# Patient Record
Sex: Male | Born: 1960
Health system: Southern US, Community
[De-identification: ages and names within clinical notes are randomized; demographics above are authoritative.]

## PROBLEM LIST (undated history)

## (undated) DIAGNOSIS — K760 Fatty (change of) liver, not elsewhere classified: Secondary | ICD-10-CM

## (undated) DIAGNOSIS — E781 Pure hyperglyceridemia: Secondary | ICD-10-CM

## (undated) DIAGNOSIS — I10 Essential (primary) hypertension: Secondary | ICD-10-CM

## (undated) DIAGNOSIS — N200 Calculus of kidney: Secondary | ICD-10-CM

## (undated) DIAGNOSIS — T7840XA Allergy, unspecified, initial encounter: Secondary | ICD-10-CM

## (undated) DIAGNOSIS — E669 Obesity, unspecified: Secondary | ICD-10-CM

## (undated) HISTORY — DX: Obesity, unspecified: E66.9

## (undated) HISTORY — DX: Fatty (change of) liver, not elsewhere classified: K76.0

## (undated) HISTORY — DX: Pure hyperglyceridemia: E78.1

## (undated) HISTORY — DX: Essential (primary) hypertension: I10

## (undated) HISTORY — PX: COLONOSCOPY: SHX174

## (undated) HISTORY — PX: URETHRAL STRICTURE DILATATION: SHX477

## (undated) HISTORY — DX: Calculus of kidney: N20.0

## (undated) HISTORY — DX: Allergy, unspecified, initial encounter: T78.40XA

---

## 2009-04-27 ENCOUNTER — Ambulatory Visit: Payer: Self-pay | Admitting: Family Medicine

## 2009-05-10 ENCOUNTER — Ambulatory Visit: Payer: Self-pay | Admitting: Family Medicine

## 2009-07-13 ENCOUNTER — Ambulatory Visit: Payer: Self-pay | Admitting: Family Medicine

## 2009-12-15 ENCOUNTER — Ambulatory Visit: Payer: Self-pay | Admitting: Family Medicine

## 2009-12-28 ENCOUNTER — Ambulatory Visit (HOSPITAL_BASED_OUTPATIENT_CLINIC_OR_DEPARTMENT_OTHER): Admission: RE | Admit: 2009-12-28 | Discharge: 2009-12-28 | Payer: Self-pay | Admitting: Urology

## 2010-09-29 ENCOUNTER — Ambulatory Visit
Admission: RE | Admit: 2010-09-29 | Discharge: 2010-09-29 | Payer: Self-pay | Source: Home / Self Care | Attending: Family Medicine | Admitting: Family Medicine

## 2010-10-06 ENCOUNTER — Ambulatory Visit
Admission: RE | Admit: 2010-10-06 | Discharge: 2010-10-06 | Payer: Self-pay | Source: Home / Self Care | Attending: Family Medicine | Admitting: Family Medicine

## 2010-10-20 ENCOUNTER — Ambulatory Visit: Admit: 2010-10-20 | Payer: Self-pay | Admitting: Family Medicine

## 2010-12-14 LAB — POCT HEMOGLOBIN-HEMACUE: Hemoglobin: 16.6 g/dL (ref 13.0–17.0)

## 2011-03-31 ENCOUNTER — Other Ambulatory Visit: Payer: Self-pay | Admitting: Family Medicine

## 2011-05-12 ENCOUNTER — Other Ambulatory Visit: Payer: Self-pay | Admitting: Family Medicine

## 2011-05-12 ENCOUNTER — Ambulatory Visit (INDEPENDENT_AMBULATORY_CARE_PROVIDER_SITE_OTHER): Payer: 59 | Admitting: Family Medicine

## 2011-05-12 ENCOUNTER — Encounter: Payer: Self-pay | Admitting: Family Medicine

## 2011-05-12 DIAGNOSIS — E119 Type 2 diabetes mellitus without complications: Secondary | ICD-10-CM

## 2011-05-12 DIAGNOSIS — Z79899 Other long term (current) drug therapy: Secondary | ICD-10-CM

## 2011-05-12 DIAGNOSIS — E1169 Type 2 diabetes mellitus with other specified complication: Secondary | ICD-10-CM

## 2011-05-12 DIAGNOSIS — E785 Hyperlipidemia, unspecified: Secondary | ICD-10-CM

## 2011-05-12 DIAGNOSIS — I10 Essential (primary) hypertension: Secondary | ICD-10-CM

## 2011-05-12 DIAGNOSIS — E1159 Type 2 diabetes mellitus with other circulatory complications: Secondary | ICD-10-CM

## 2011-05-12 DIAGNOSIS — I152 Hypertension secondary to endocrine disorders: Secondary | ICD-10-CM

## 2011-05-12 LAB — COMPREHENSIVE METABOLIC PANEL
Albumin: 4.6 g/dL (ref 3.5–5.2)
BUN: 16 mg/dL (ref 6–23)
CO2: 23 mEq/L (ref 19–32)
Calcium: 9.9 mg/dL (ref 8.4–10.5)
Glucose, Bld: 102 mg/dL — ABNORMAL HIGH (ref 70–99)
Potassium: 3.9 mEq/L (ref 3.5–5.3)
Sodium: 139 mEq/L (ref 135–145)
Total Protein: 7 g/dL (ref 6.0–8.3)

## 2011-05-12 LAB — LIPID PANEL
Cholesterol: 138 mg/dL (ref 0–200)
Total CHOL/HDL Ratio: 5.1 Ratio

## 2011-05-12 LAB — POCT GLYCOSYLATED HEMOGLOBIN (HGB A1C): Hemoglobin A1C: 5.4

## 2011-05-12 MED ORDER — GLUCOSE BLOOD VI STRP: ORAL_STRIP | Status: AC

## 2011-05-12 NOTE — Patient Instructions (Signed)
Check your blood sugars periodically either before or 2 hours after a meal. If you do not hear from the nutritionist in 2 weeks, call us.

## 2011-05-12 NOTE — Progress Notes (Signed)
  Subjective:    Patient ID: Trevor Johnson, male    DOB: 20-Nov-1960, 50 y.o.   MRN: [id number]  HPI He is here for a diabetes recheck. He has made some dietary changes and has lost several pounds. He also has not been able to check his blood sugars. He had difficulty learning how to use the, or but never called to get help. He has not been seen by the nutritionist. His exercise is minimal. He continues on medications listed in the chart and is having no difficulty with them. He does not smoke. His wife did join on this visit.   Review of Systems     Objective:   Physical Exam Alert and in no distress. Glowed A1c is 5.4       Assessment & Plan:  Diabetes. Hypertension. Hyperlipidemia. Obesity. I again discussed the need for him to periodically check his blood sugars either before a meal or 2 hours after a meal. He is also increase his physical activity to a one half hour 5 days per week. Continue on present medications. Discussed the need for him to get to a waist size of approximately 36 and have this as his goal rather than a true weight. He will be set up to see the nutritionist and return here in 4 months. Lipid panel and cmet

## 2011-05-15 ENCOUNTER — Telehealth: Payer: Self-pay

## 2011-05-15 NOTE — Telephone Encounter (Signed)
Called pt he said he is takeing the tricor

## 2011-05-16 ENCOUNTER — Telehealth: Payer: Self-pay

## 2011-05-16 NOTE — Telephone Encounter (Signed)
LEFT MESSAGE DR.LALONDE WANTS HIM TO MAKE APPT TO COME IN TO DISCUSS LABS

## 2011-06-02 ENCOUNTER — Ambulatory Visit (INDEPENDENT_AMBULATORY_CARE_PROVIDER_SITE_OTHER): Payer: 59 | Admitting: Family Medicine

## 2011-06-02 VITALS — BP 120/80 | HR 100 | Wt 270.0 lb

## 2011-06-02 DIAGNOSIS — E1169 Type 2 diabetes mellitus with other specified complication: Secondary | ICD-10-CM | POA: Insufficient documentation

## 2011-06-02 DIAGNOSIS — E119 Type 2 diabetes mellitus without complications: Secondary | ICD-10-CM

## 2011-06-02 DIAGNOSIS — E669 Obesity, unspecified: Secondary | ICD-10-CM

## 2011-06-02 DIAGNOSIS — Z041 Encounter for examination and observation following transport accident: Secondary | ICD-10-CM

## 2011-06-02 DIAGNOSIS — Z23 Encounter for immunization: Secondary | ICD-10-CM

## 2011-06-02 DIAGNOSIS — E785 Hyperlipidemia, unspecified: Secondary | ICD-10-CM

## 2011-06-02 DIAGNOSIS — E1139 Type 2 diabetes mellitus with other diabetic ophthalmic complication: Secondary | ICD-10-CM | POA: Insufficient documentation
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
## 2011-06-02 NOTE — Patient Instructions (Addendum)
Return here Monday for fasting blood work. Start checking your blood sugars daily either before a meal or 2 Rx after a meal.

## 2011-06-05 ENCOUNTER — Other Ambulatory Visit: Payer: 59

## 2011-06-05 DIAGNOSIS — E785 Hyperlipidemia, unspecified: Secondary | ICD-10-CM

## 2011-06-05 LAB — LIPID PANEL
Cholesterol: 120 mg/dL (ref 0–200)
HDL: 29 mg/dL — ABNORMAL LOW (ref >39)
Triglycerides: 304 mg/dL — ABNORMAL HIGH (ref <150)

## 2011-06-06 NOTE — Progress Notes (Signed)
Mailed copy of results and diet info

## 2011-06-12 ENCOUNTER — Other Ambulatory Visit: Payer: Self-pay | Admitting: Family Medicine

## 2011-07-16 ENCOUNTER — Other Ambulatory Visit: Payer: Self-pay | Admitting: Family Medicine

## 2011-07-17 ENCOUNTER — Other Ambulatory Visit: Payer: Self-pay | Admitting: Family Medicine

## 2011-07-17 ENCOUNTER — Telehealth: Payer: Self-pay | Admitting: Family Medicine

## 2011-07-17 MED ORDER — METFORMIN HCL ER 500 MG PO TB24: 500.0000 mg | ORAL_TABLET | Freq: Two times a day (BID) | ORAL | Status: DC

## 2011-07-25 ENCOUNTER — Encounter: Payer: Self-pay | Admitting: Family Medicine

## 2011-07-25 ENCOUNTER — Other Ambulatory Visit: Payer: Self-pay | Admitting: Family Medicine

## 2011-07-25 NOTE — Telephone Encounter (Signed)
Is this okay to refill? 

## 2011-07-25 NOTE — Telephone Encounter (Signed)
CLOSE OVER 1 WK OLD

## 2011-07-25 NOTE — Telephone Encounter (Signed)
Set him up an appointment

## 2011-07-26 NOTE — Telephone Encounter (Signed)
He has an appt in December already to come in and he said he came in a month ago. Does he need to still come in? The pharmacy has called him and said that the metformin he was prescribed to was not the one they have been refilling.

## 2011-08-30 ENCOUNTER — Encounter: Payer: Self-pay | Admitting: Medical

## 2011-08-30 ENCOUNTER — Ambulatory Visit (INDEPENDENT_AMBULATORY_CARE_PROVIDER_SITE_OTHER): Payer: 59 | Admitting: Medical

## 2011-08-30 VITALS — BP 122/82 | HR 68 | Temp 99.0°F | Resp 16 | Wt 265.0 lb

## 2011-08-30 DIAGNOSIS — J069 Acute upper respiratory infection, unspecified: Secondary | ICD-10-CM

## 2011-08-30 DIAGNOSIS — R059 Cough, unspecified: Secondary | ICD-10-CM

## 2011-08-30 DIAGNOSIS — R05 Cough: Secondary | ICD-10-CM | POA: Insufficient documentation

## 2011-08-30 MED ORDER — AMOXICILLIN 500 MG PO TABS: ORAL_TABLET | ORAL | Status: DC

## 2011-08-30 MED ORDER — HYDROCODONE-HOMATROPINE 5-1.5 MG/5ML PO SYRP: 5.0000 mL | ORAL_SOLUTION | Freq: Four times a day (QID) | ORAL | Status: DC | PRN

## 2011-08-30 NOTE — Patient Instructions (Signed)
Rest, drink plenty of water, try Mucinex DM or Coricidin HBP OTC for cough and congestion, Ibuprofen 200mg , 3-4 tablets every 6 hours for aches and fever.,  You can use Hycodan syrup for cough.  If worse over the next few days, particularly worse fever, worse head pressure and thick drainage, then begin Amoxicillin.    Call or return if worse or not improving.

## 2011-08-30 NOTE — Progress Notes (Signed)
Subjective:   HPI  Trevor Johnson is a 50 y.o. male who presents with 4 day hx/o cough, headache, sinus pressure, malaise, earache, chills, sweats, runny nose, aches, and worsening over the last few days.  He has +sick contacts.  Using Dayquil and Nyquil.  He denies tobacco use, no diarrhea, but he did have some nausea and vomiting few times.  Usually is not sick.  He did get flu shot this year.  He is a diabetic with good control per his report.  No other aggravating or relieving factors.  He notes fever up to 101.  No other c/o.  The following portions of the patient's history were reviewed and updated as appropriate: allergies, current medications, past family history, past medical history, past social history, past surgical history and problem list.  Past Medical History  Diagnosis Date  . Obesity   . Allergy     RHINITIS  . Renal stone   . Hypertension   . Diabetes mellitus   . Hypertriglyceridemia    Review of Systems Constitutional: +fever, +chills, +sweats, -unexpected -weight change,+fatigue ENT: +runny nose, +ear pain, +sore throat Cardiology:  -chest pain, -palpitations, -edema Respiratory: +cough, -shortness of breath, -wheezing Gastroenterology: -abdominal pain, +nausea, -vomiting, -diarrhea, -constipation Hematology: -bleeding or bruising problems Musculoskeletal: -arthralgias, -myalgias, -joint swelling, -back pain Ophthalmology: -vision changes Urology: -dysuria, -difficulty urinating, -hematuria, -urinary frequency, -urgency Neurology: -headache, -weakness, -tingling, -numbness   Objective:   Filed Vitals:   08/30/11 1204  BP: 122/82  Pulse: 68  Temp: 99 F (37.2 C)  Resp: 16    General appearance: Alert, WD/WN, no distress, ill appearing                             Skin: warm, no rash                           Head: mild sinus tenderness                            Eyes: conjunctiva normal, corneas clear, PERRLA                            Ears: pearly  TMs, external ear canals normal                          Nose: septum midline, turbinates swollen, with erythema and clear discharge             Mouth/throat: MMM, tongue normal, mild pharyngeal erythema                           Neck: supple, no adenopathy, no thyromegaly, nontender                          Heart: RRR, normal S1, S2, no murmurs                         Lungs: CTA bilaterally, no wheezes, rales, or rhonchi     Assessment and Plan:   Encounter Diagnoses  Name Primary?  . URI (upper respiratory infection) Yes  . Cough    Discussed diagnosis and treatment of URI.  Suggested symptomatic OTC remedies.   Consider  Mucinex or Coricidin instead of Dayquil given his hx/o HTN.   Nasal saline spray for congestion.  Tylenol or Ibuprofen OTC for fever and malaise.  Hycodan for worse cough QHS prn.  If worsening over the next few days, can begin Amoxicillin, otherwise current findings suggest viral URI. Call/return in 2-3 days if symptoms aren't resolving.

## 2011-09-04 ENCOUNTER — Telehealth: Payer: Self-pay | Admitting: Internal Medicine

## 2011-09-04 NOTE — Telephone Encounter (Signed)
This one is for you 

## 2011-09-05 ENCOUNTER — Other Ambulatory Visit: Payer: Self-pay | Admitting: Medical

## 2011-09-05 MED ORDER — HYDROCODONE-HOMATROPINE 5-1.5 MG/5ML PO SYRP: 5.0000 mL | ORAL_SOLUTION | Freq: Four times a day (QID) | ORAL | Status: AC | PRN

## 2011-09-05 NOTE — Telephone Encounter (Signed)
CALLED OUT A REFILL ON HYCODON TO THE PHARMACY PER SHANE TYSINGER. CLS  DONE!

## 2011-09-05 NOTE — Telephone Encounter (Signed)
See separate msg.  Pls call out refill on cough medication, see if he started the antibiotic?  What are his current symptoms? Fever?  Is he better, same, worse?

## 2011-09-11 ENCOUNTER — Encounter: Payer: Self-pay | Admitting: Family Medicine

## 2011-09-11 ENCOUNTER — Ambulatory Visit (INDEPENDENT_AMBULATORY_CARE_PROVIDER_SITE_OTHER): Payer: 59 | Admitting: Family Medicine

## 2011-09-11 VITALS — BP 130/90 | HR 80 | Wt 267.0 lb

## 2011-09-11 DIAGNOSIS — E669 Obesity, unspecified: Secondary | ICD-10-CM

## 2011-09-11 DIAGNOSIS — Z79899 Other long term (current) drug therapy: Secondary | ICD-10-CM

## 2011-09-11 DIAGNOSIS — E119 Type 2 diabetes mellitus without complications: Secondary | ICD-10-CM

## 2011-09-11 DIAGNOSIS — E785 Hyperlipidemia, unspecified: Secondary | ICD-10-CM

## 2011-09-11 DIAGNOSIS — I152 Hypertension secondary to endocrine disorders: Secondary | ICD-10-CM

## 2011-09-11 DIAGNOSIS — I10 Essential (primary) hypertension: Secondary | ICD-10-CM

## 2011-09-11 DIAGNOSIS — E1159 Type 2 diabetes mellitus with other circulatory complications: Secondary | ICD-10-CM

## 2011-09-11 DIAGNOSIS — Z23 Encounter for immunization: Secondary | ICD-10-CM

## 2011-09-11 DIAGNOSIS — E1169 Type 2 diabetes mellitus with other specified complication: Secondary | ICD-10-CM

## 2011-09-11 NOTE — Patient Instructions (Signed)
Keep up the good work

## 2011-09-11 NOTE — Progress Notes (Signed)
  Subjective:    Patient ID: Trevor Johnson, male    DOB: 03-31-1961, 50 y.o.   MRN: [id number]  HPI He is here for a diabetes recheck. He has made some dietary exercise changes. He is checking his blood sugars intermittently and at various times throughout the day. Continues on medications listed in the chart. Smoking and drinking were reviewed. His work continues to go well.   Review of Systems Negative except as above    Objective:   Physical Exam Alert and in no distress. Hemoglobin A1c is 5.4.       Assessment & Plan:   1. Type II or unspecified type diabetes mellitus without mention of complication, not stated as uncontrolled  POCT HgB A1C  2. Diabetes mellitus  CBC with Differential, Comprehensive metabolic panel, Lipid panel  3. Hyperlipidemia LDL goal < 70  Lipid panel  4. Obesity (BMI 30-39.9)    5. Hypertension associated with diabetes  CBC with Differential, Comprehensive metabolic panel  6. Encounter for long-term (current) use of other medications  CBC with Differential, Comprehensive metabolic panel, Lipid panel   Encouraged him to continue take good care of himself in regard to diet and exercise. Also recommend he check his blood sugars either before a meal or 2 hours after a meal. Followup here in 4 months.

## 2011-09-12 LAB — COMPREHENSIVE METABOLIC PANEL
Albumin: 4.4 g/dL (ref 3.5–5.2)
BUN: 12 mg/dL (ref 6–23)
CO2: 24 mEq/L (ref 19–32)
Glucose, Bld: 102 mg/dL — ABNORMAL HIGH (ref 70–99)
Sodium: 138 mEq/L (ref 135–145)
Total Bilirubin: 0.6 mg/dL (ref 0.3–1.2)
Total Protein: 6.6 g/dL (ref 6.0–8.3)

## 2011-09-12 LAB — CBC WITH DIFFERENTIAL/PLATELET
Eosinophils Absolute: 0.1 10*3/uL (ref 0.0–0.7)
HCT: 46.2 % (ref 39.0–52.0)
Hemoglobin: 15.1 g/dL (ref 13.0–17.0)
Lymphs Abs: 1.8 10*3/uL (ref 0.7–4.0)
MCH: 30.8 pg (ref 26.0–34.0)
MCHC: 32.7 g/dL (ref 30.0–36.0)
MCV: 94.3 fL (ref 78.0–100.0)
Monocytes Absolute: 0.5 10*3/uL (ref 0.1–1.0)
Monocytes Relative: 8 % (ref 3–12)
Neutrophils Relative %: 62 % (ref 43–77)
RBC: 4.9 MIL/uL (ref 4.22–5.81)

## 2011-09-12 LAB — LIPID PANEL
Cholesterol: 108 mg/dL (ref 0–200)
HDL: 23 mg/dL — ABNORMAL LOW (ref >39)
Triglycerides: 410 mg/dL — ABNORMAL HIGH (ref <150)

## 2011-10-13 ENCOUNTER — Other Ambulatory Visit: Payer: Self-pay | Admitting: Family Medicine

## 2011-11-08 ENCOUNTER — Other Ambulatory Visit: Payer: Self-pay | Admitting: Family Medicine

## 2011-12-06 ENCOUNTER — Ambulatory Visit: Payer: 59 | Admitting: Medical

## 2012-01-08 ENCOUNTER — Ambulatory Visit: Payer: 59 | Admitting: Family Medicine

## 2012-01-15 ENCOUNTER — Ambulatory Visit (INDEPENDENT_AMBULATORY_CARE_PROVIDER_SITE_OTHER): Payer: 59 | Admitting: Family Medicine

## 2012-01-15 ENCOUNTER — Encounter: Payer: Self-pay | Admitting: Family Medicine

## 2012-01-15 DIAGNOSIS — E119 Type 2 diabetes mellitus without complications: Secondary | ICD-10-CM

## 2012-01-15 DIAGNOSIS — E785 Hyperlipidemia, unspecified: Secondary | ICD-10-CM

## 2012-01-15 DIAGNOSIS — E669 Obesity, unspecified: Secondary | ICD-10-CM

## 2012-01-15 LAB — POCT UA - MICROALBUMIN
Albumin/Creatinine Ratio, Urine, POC: 4.4
Creatinine, POC: 113.3 mg/dL

## 2012-01-15 NOTE — Progress Notes (Signed)
  Subjective:    Patient ID: Trevor Johnson, male    DOB: May 02, 1961, 51 y.o.   MRN: [id number]  HPI He is here for a diabetes recheck. He continues on medicines listed in the chart. He has had difficulty sticking with his diet. He admits to eating lots of sweets. His exercise is quite minimal. Smoking and drinking history was reviewed.   Review of Systems     Objective:   Physical Exam Alert and in no distress. Hemoglobin A1c is 5.7.       Assessment & Plan:   1. Diabetes mellitus  POCT HgB A1C, POCT UA - Microalbumin  2. Obesity (BMI 30-39.9)    3. Hyperlipidemia LDL goal < 70     proximally half an hour spent discussing diabetes care with him especially in regard to diet and exercise. Strongly encouraged him to approach this is what can be done rather than what cannot be done as specimen are to foods.

## 2012-01-15 NOTE — Patient Instructions (Signed)
Think positive. Remember the pink elephant!

## 2012-02-14 ENCOUNTER — Other Ambulatory Visit: Payer: Self-pay | Admitting: Family Medicine

## 2012-05-20 ENCOUNTER — Other Ambulatory Visit: Payer: Self-pay | Admitting: Family Medicine

## 2012-05-20 ENCOUNTER — Encounter: Payer: Self-pay | Admitting: Family Medicine

## 2012-05-20 ENCOUNTER — Ambulatory Visit (INDEPENDENT_AMBULATORY_CARE_PROVIDER_SITE_OTHER): Payer: 59 | Admitting: Family Medicine

## 2012-05-20 VITALS — Wt 281.0 lb

## 2012-05-20 DIAGNOSIS — E669 Obesity, unspecified: Secondary | ICD-10-CM

## 2012-05-20 DIAGNOSIS — E785 Hyperlipidemia, unspecified: Secondary | ICD-10-CM

## 2012-05-20 DIAGNOSIS — I152 Hypertension secondary to endocrine disorders: Secondary | ICD-10-CM

## 2012-05-20 DIAGNOSIS — E1169 Type 2 diabetes mellitus with other specified complication: Secondary | ICD-10-CM

## 2012-05-20 DIAGNOSIS — E1159 Type 2 diabetes mellitus with other circulatory complications: Secondary | ICD-10-CM | POA: Insufficient documentation

## 2012-05-20 DIAGNOSIS — E119 Type 2 diabetes mellitus without complications: Secondary | ICD-10-CM

## 2012-05-20 DIAGNOSIS — I10 Essential (primary) hypertension: Secondary | ICD-10-CM

## 2012-05-20 DIAGNOSIS — Z87442 Personal history of urinary calculi: Secondary | ICD-10-CM

## 2012-05-20 NOTE — Progress Notes (Signed)
  Subjective:    Patient ID: Trevor Johnson, male    DOB: 11-26-1960, 51 y.o.   MRN: [id number]  HPI He is here for a recheck. He has started working out. He notes that his shirts are fitting looser as well as his pants. Weight is elevated and probably related to muscle. He continues on medications listed in the chart. He is not check his blood sugars since his last visit. Is not smoke, does not drink  Review of Systems     Objective:   Physical Exam HbA1C 5.6       Assessment & Plan:   1. Diabetes mellitus  POCT glycosylated hemoglobin (Hb A1C)  2. Obesity (BMI 30-39.9)    3. Hyperlipidemia LDL goal < 70    4. Hypertension associated with diabetes    5. History of renal stone     Check your blood sugar either before a meal or 2 hours after a meal. Any blood sugar above 180, I want to know about it. I may consider stopping his metformin with his next visit. He is to work on getting his waist size down under 40.

## 2012-05-20 NOTE — Patient Instructions (Addendum)
Check your blood sugar either before a meal or 2 hours after a meal. Any blood sugar above 180, I want to know about it.   If you get your waist size down under 40, I will stop your metformin!!!

## 2012-06-14 ENCOUNTER — Other Ambulatory Visit (INDEPENDENT_AMBULATORY_CARE_PROVIDER_SITE_OTHER): Payer: 59

## 2012-06-14 DIAGNOSIS — Z23 Encounter for immunization: Secondary | ICD-10-CM

## 2012-07-31 ENCOUNTER — Other Ambulatory Visit: Payer: Self-pay | Admitting: Family Medicine

## 2012-09-16 ENCOUNTER — Ambulatory Visit: Payer: 59 | Admitting: Family Medicine

## 2012-10-29 ENCOUNTER — Other Ambulatory Visit: Payer: Self-pay

## 2012-10-29 MED ORDER — LISINOPRIL-HYDROCHLOROTHIAZIDE 10-12.5 MG PO TABS: 1.0000 | ORAL_TABLET | Freq: Every day | ORAL | Status: DC

## 2012-10-29 MED ORDER — ATORVASTATIN CALCIUM 20 MG PO TABS: 20.0000 mg | ORAL_TABLET | Freq: Every day | ORAL | Status: DC

## 2012-10-29 MED ORDER — METFORMIN HCL 500 MG PO TABS: 500.0000 mg | ORAL_TABLET | Freq: Two times a day (BID) | ORAL | Status: DC

## 2012-10-29 NOTE — Telephone Encounter (Signed)
Sent in meds 

## 2012-11-05 ENCOUNTER — Other Ambulatory Visit: Payer: Self-pay | Admitting: Family Medicine

## 2013-02-06 ENCOUNTER — Telehealth: Payer: Self-pay | Admitting: Internal Medicine

## 2013-02-06 NOTE — Telephone Encounter (Signed)
Left message asking patient to return my call to schedule fasting med check with Dr.Lalonde as he was last seen in Aug 2013 and was due for a 4 month follow up in Dec. Once he scheduled appointment I will call his HCTZ into pharmacy for him.

## 2013-02-06 NOTE — Telephone Encounter (Signed)
Pt needs a refill on htcz 12.5mg  to [facility]

## 2013-02-10 ENCOUNTER — Other Ambulatory Visit: Payer: Self-pay | Admitting: *Deleted

## 2013-02-10 MED ORDER — HYDROCHLOROTHIAZIDE 12.5 MG PO CAPS: 12.5000 mg | ORAL_CAPSULE | ORAL | Status: DC

## 2013-02-10 NOTE — Telephone Encounter (Signed)
Ok called in #30 for him to hold him until appt with Dr.Lalonde 03/10/13.

## 2013-03-10 ENCOUNTER — Encounter: Payer: Self-pay | Admitting: Family Medicine

## 2013-03-10 ENCOUNTER — Ambulatory Visit (INDEPENDENT_AMBULATORY_CARE_PROVIDER_SITE_OTHER): Payer: BC Managed Care – PPO | Admitting: Family Medicine

## 2013-03-10 VITALS — BP 120/76 | HR 80 | Wt 245.0 lb

## 2013-03-10 DIAGNOSIS — Z79899 Other long term (current) drug therapy: Secondary | ICD-10-CM

## 2013-03-10 DIAGNOSIS — E119 Type 2 diabetes mellitus without complications: Secondary | ICD-10-CM

## 2013-03-10 LAB — POCT UA - MICROALBUMIN
Creatinine, POC: 87.1 mg/dL
Microalbumin Ur, POC: 31.5 mg/L

## 2013-03-10 NOTE — Progress Notes (Signed)
Subjective:    Trevor Johnson is a 52 y.o. male who presents for follow-up of Type 2 diabetes mellitus.   He was last seen in August. He has made diet and exercise changes and has lost over 30 pounds. Home blood sugar records: DOESN'T CHECK  Current symptoms/problems include  Daily foot checks, foot concerns: TOES TING. Last eye exam:  [facility] 12/2012   Medication compliance: good Current diet: LOW SUGAR Current exercise: GYM  Known diabetic complications: none Cardiovascular risk factors: diabetes mellitus, dyslipidemia and male gender   The following portions of the patient's history were reviewed and updated as appropriate: allergies, current medications, past family history, past medical history, past social history, past surgical history and problem list.  ROS as in subjective above    Objective:   General appearence: alert, no distress, WD/WN Neck: supple, no lymphadenopathy, no thyromegaly, no masses Heart: RRR, normal S1, S2, no murmurs Lungs: CTA bilaterally, no wheezes, rhonchi, or rales Abdomen: +bs, soft, non tender, non distended, no masses, no hepatomegaly, no splenomegaly Pulses: 2+ symmetric, upper and lower extremities, normal cap refill Ext: no edema Foot exam:  Neuro: foot monofilament exam normal   Lab Review Lab Results  Component Value Date   HGBA1C 5.6 05/20/2012   Lab Results  Component Value Date   CHOL 108 09/11/2011   HDL 23* 09/11/2011   LDLCALC Comment:   Not calculated due to Triglyceride >400. Suggest ordering Direct LDL (Unit Code: 47829).   Total Cholesterol/HDL Ratio:CHD Risk                        Coronary Heart Disease Risk Table                                        Men       Women          1/2 Average Risk              3.4        3.3              Average Risk              5.0        4.4           2X Average Risk              9.6        7.1           3X Average Risk             23.4       11.0 Use the calculated  Patient Ratio above and the CHD Risk table  to determine the patient's CHD Risk. ATP III Classification (LDL):       < 100        mg/dL         Optimal      562 - 129     mg/dL         Near or Above Optimal      130 - 159     mg/dL         Borderline High      160 - 189     mg/dL         High       >  190        mg/dL         Very High   91/47/8295   TRIG 410* 09/11/2011   CHOLHDL 4.7 09/11/2011   No results found for this basename: [facility] [place]     Chemistry      Component Value Date/Time   NA 138 09/11/2011 0838   K 4.1 09/11/2011 0838   CL 102 09/11/2011 0838   CO2 24 09/11/2011 0838   BUN 12 09/11/2011 0838   CREATININE 0.93 09/11/2011 0838      Component Value Date/Time   CALCIUM 9.4 09/11/2011 0838   ALKPHOS 71 09/11/2011 0838   AST 32 09/11/2011 0838   ALT 47 09/11/2011 0838   BILITOT 0.6 09/11/2011 0838        Chemistry      Component Value Date/Time   NA 138 09/11/2011 0838   K 4.1 09/11/2011 0838   CL 102 09/11/2011 0838   CO2 24 09/11/2011 0838   BUN 12 09/11/2011 0838   CREATININE 0.93 09/11/2011 0838      Component Value Date/Time   CALCIUM 9.4 09/11/2011 0838   ALKPHOS 71 09/11/2011 0838   AST 32 09/11/2011 0838   ALT 47 09/11/2011 0838   BILITOT 0.6 09/11/2011 0838       Last optometry/ophthalmology exam reviewed from:    Assessment:  Diabetes mellitus - Plan: POCT UA - Microalbumin, Hemoglobin A1c, CANCELED: POCT glycosylated hemoglobin (Hb A1C)  Encounter for long-term (current) use of other medications - Plan: CBC with Differential, Comprehensive metabolic panel, Lipid panel, HM COLONOSCOPY        Plan:    1.  Rx changes: none 2.  Education: Reviewed 'ABCs' of diabetes management (respective goals in parentheses):  A1C (<7), blood pressure (<130/80), and cholesterol (LDL <100). 3.  Compliance at present is estimated to be good. Efforts to improve compliance (if necessary) will be directed at none. 4. Follow up: 4 months   Encouraged him to continue with his diet and exercise regimen. Encouraged him to check his blood sugars will more frequently.

## 2013-03-10 NOTE — Progress Notes (Signed)
  Subjective:    Patient ID: Trevor Johnson, male    DOB: 09/19/1961, 52 y.o.   MRN: [id number]  HPI   Review of Systems     Objective:   Physical Exam        Assessment & Plan:

## 2013-03-10 NOTE — Patient Instructions (Signed)
Keep up your diet and exercise. We will talk about possibly taking off metformin with her next visit

## 2013-03-11 ENCOUNTER — Other Ambulatory Visit: Payer: Self-pay | Admitting: Family Medicine

## 2013-03-11 ENCOUNTER — Ambulatory Visit (INDEPENDENT_AMBULATORY_CARE_PROVIDER_SITE_OTHER): Payer: BC Managed Care – PPO | Admitting: Family Medicine

## 2013-03-11 DIAGNOSIS — R7309 Other abnormal glucose: Secondary | ICD-10-CM

## 2013-03-11 DIAGNOSIS — E119 Type 2 diabetes mellitus without complications: Secondary | ICD-10-CM

## 2013-03-11 LAB — COMPREHENSIVE METABOLIC PANEL
ALT: 38 U/L (ref 0–53)
Alkaline Phosphatase: 112 U/L (ref 39–117)
Sodium: 126 mEq/L — ABNORMAL LOW (ref 135–145)
Total Bilirubin: 0.3 mg/dL (ref 0.3–1.2)
Total Protein: 6.7 g/dL (ref 6.0–8.3)

## 2013-03-11 LAB — CBC WITH DIFFERENTIAL/PLATELET
Basophils Absolute: 0 10*3/uL (ref 0.0–0.1)
Basophils Relative: 0 % (ref 0–1)
Eosinophils Absolute: 0.1 10*3/uL (ref 0.0–0.7)
MCH: 32.4 pg (ref 26.0–34.0)
MCHC: 33.3 g/dL (ref 30.0–36.0)
Neutrophils Relative %: 40 % — ABNORMAL LOW (ref 43–77)
Platelets: 143 10*3/uL — ABNORMAL LOW (ref 150–400)

## 2013-03-11 LAB — LIPID PANEL: Triglycerides: 5860 mg/dL — ABNORMAL HIGH (ref <150)

## 2013-03-11 LAB — HEMOGLOBIN A1C: Mean Plasma Glucose: 240 mg/dL — ABNORMAL HIGH (ref <117)

## 2013-03-11 MED ORDER — INSULIN LISPRO 100 UNIT/ML ~~LOC~~ SOLN
10.0000 [IU] | Freq: Once | SUBCUTANEOUS | Status: AC
  Administered 2013-03-11: 10 [IU] via SUBCUTANEOUS

## 2013-03-11 MED ORDER — PIOGLITAZONE HCL-METFORMIN HCL 15-850 MG PO TABS: 1.0000 | ORAL_TABLET | Freq: Two times a day (BID) | ORAL | Status: DC

## 2013-03-11 NOTE — Progress Notes (Signed)
Quick Note:  PT STATES HE WILL CALL Thursday MORNING TO MAKE APPT. TO COME IN Friday ______

## 2013-03-11 NOTE — Progress Notes (Signed)
  Subjective:    Patient ID: Trevor Johnson, male    DOB: [birth year], 52 y.o.   MRN: [id number]  HPI He is here for recheck. His blood work was reviewed and was quite abnormal. He stopped his metformin approximately 6 weeks ago and started back on it 2 weeks ago. He now states that he is still having some difficulty with polyuria polydipsia although his vision has improved.   Review of Systems     Objective:   Physical Exam Alert and in no distress. He's not tachypnea. Blood sugar 558       Assessment & Plan:  Diabetes mellitus I discussed the fact that his blood sugar is quite high and we need to get this down relatively quickly. I will start him on 10 units of regular and 10 of Levemir. He is to return in the morning for repeat blood sugar and adjust medicine accordingly. Will be referred to medical nutrition and diabetes. We will also teach him how to use his glucometer tomorrow. I again stressed the fact that he is in danger with blood sugars is elevated. I will also switch him to Actos plus.

## 2013-03-12 ENCOUNTER — Other Ambulatory Visit (INDEPENDENT_AMBULATORY_CARE_PROVIDER_SITE_OTHER): Payer: BC Managed Care – PPO

## 2013-03-12 DIAGNOSIS — R7309 Other abnormal glucose: Secondary | ICD-10-CM

## 2013-03-17 ENCOUNTER — Telehealth: Payer: Self-pay

## 2013-03-17 NOTE — Telephone Encounter (Signed)
PT CALLED AND STATED HIS B/S IS COMING DOWN HE IS 231 TODAY JUST WANTED TO LET YOU KNOW

## 2013-03-18 NOTE — Telephone Encounter (Signed)
Have him continue with her present insulin dosing and call us with the readings on Thursday

## 2013-03-18 NOTE — Telephone Encounter (Signed)
CALLED PT HE WAS INFORMED TO CALL Thursday AND LET us KNOW WHAT HIS B/S READING IS PT VERBALIZED UNDERSTANDING

## 2013-03-20 ENCOUNTER — Telehealth: Payer: Self-pay

## 2013-03-20 NOTE — Telephone Encounter (Signed)
He needs to come in for blood work. Get him in today

## 2013-03-20 NOTE — Telephone Encounter (Signed)
Pt can not come in today but will be here tomorrow at 2:15

## 2013-03-20 NOTE — Telephone Encounter (Signed)
PATIENT CALLED TO LET YOU KNOW HIS BLOOD SUGAR WAS 242 THIS MORNING HE SAID HE HAS A COLD AND HAD TAKEN NYQUIL LAST NIGHT BUT HE IS NOW OUT OF INSULIN PLEASE ADVISE

## 2013-03-21 ENCOUNTER — Ambulatory Visit (INDEPENDENT_AMBULATORY_CARE_PROVIDER_SITE_OTHER): Payer: BC Managed Care – PPO | Admitting: Family Medicine

## 2013-03-21 VITALS — BP 128/80 | HR 80 | Wt 245.0 lb

## 2013-03-21 DIAGNOSIS — E119 Type 2 diabetes mellitus without complications: Secondary | ICD-10-CM

## 2013-03-21 DIAGNOSIS — Z79899 Other long term (current) drug therapy: Secondary | ICD-10-CM

## 2013-03-21 LAB — BASIC METABOLIC PANEL
BUN: 8 mg/dL (ref 6–23)
Chloride: 100 mEq/L (ref 96–112)
Glucose, Bld: 235 mg/dL — ABNORMAL HIGH (ref 70–99)
Potassium: 3.9 mEq/L (ref 3.5–5.3)

## 2013-03-21 NOTE — Progress Notes (Signed)
Quick Note:  CALLED PT TO INFORM HIM OF HIS LAB Let him know that his blood work looks better. Have him call me next Wednesday let me know how he is doing to PT VERBALIZED UNDERSTANDING ______

## 2013-03-21 NOTE — Progress Notes (Signed)
  Subjective:    Patient ID: Trevor Johnson, male    DOB: 1961/02/15, 52 y.o.   MRN: [id number]  HPI He is here for recheck. His numbers are now in the low 200s he is still taking 10 of Levemir. He has no other concerns or complaints   Review of Systems     Objective:   Physical Exam  Alert and in no distress otherwise not examined      Assessment & Plan:  Diabetes mellitus - Plan: Basic Metabolic Panel  Encounter for long-term (current) use of other medications - Plan: Basic Metabolic Panel

## 2013-03-26 ENCOUNTER — Telehealth: Payer: Self-pay

## 2013-03-26 NOTE — Telephone Encounter (Signed)
Let him know that I think his vision will improve as his blood sugars stable low 200. See if you can schedule him next week to be seen here. Make sure he has enough insulin

## 2013-03-26 NOTE — Telephone Encounter (Signed)
DR.LALONDE Trevor Johnson CALLED AND SAID YESTERDAY B/S WAS 260 TODAY 158 HIS VISION HAS NOT GOTTEN BETTER THEY WERE WORSE PLEASE ADVISE

## 2013-03-26 NOTE — Telephone Encounter (Signed)
LEFT WORD FOR WORD MESSAGE FOR PT 

## 2013-04-04 ENCOUNTER — Ambulatory Visit (INDEPENDENT_AMBULATORY_CARE_PROVIDER_SITE_OTHER): Payer: BC Managed Care – PPO | Admitting: Family Medicine

## 2013-04-04 ENCOUNTER — Encounter: Payer: Self-pay | Admitting: Family Medicine

## 2013-04-04 VITALS — BP 116/70 | HR 80 | Wt 245.0 lb

## 2013-04-04 DIAGNOSIS — E119 Type 2 diabetes mellitus without complications: Secondary | ICD-10-CM

## 2013-04-04 NOTE — Progress Notes (Signed)
  Subjective:    Patient ID: Trevor Johnson, male    DOB: 1961-03-25, 52 y.o.   MRN: [id number]  HPI He is here for a recheck. His blood sugars are now running in the low 100s. He is still having some slight difficulty with blurred vision but states that it is slowly improving.   Review of Systems     Objective:   Physical Exam Alert and in no distress otherwise not examined       Assessment & Plan:  Diabetes mellitus  I will have him stop his insulin and keep track of his blood sugars. As long as they do not stay in the 200 range I will continue on his present regimen. He has made dietary changes. Recheck here in 3 months or sooner has difficulty.

## 2013-04-04 NOTE — Patient Instructions (Addendum)
Stop the insulin to stay on all your medications. Let's hold off on the ophthalmology and see where her vision and is up stabilizing If your blood sugars start spending too much time in the 200 range let me know

## 2013-04-22 ENCOUNTER — Ambulatory Visit: Payer: 59

## 2013-06-10 ENCOUNTER — Other Ambulatory Visit: Payer: Self-pay | Admitting: Family Medicine

## 2013-07-07 ENCOUNTER — Ambulatory Visit (INDEPENDENT_AMBULATORY_CARE_PROVIDER_SITE_OTHER): Payer: BC Managed Care – PPO | Admitting: Family Medicine

## 2013-07-07 ENCOUNTER — Encounter: Payer: Self-pay | Admitting: Family Medicine

## 2013-07-07 VITALS — BP 124/78 | HR 90 | Wt 266.0 lb

## 2013-07-07 DIAGNOSIS — E1159 Type 2 diabetes mellitus with other circulatory complications: Secondary | ICD-10-CM

## 2013-07-07 DIAGNOSIS — I152 Hypertension secondary to endocrine disorders: Secondary | ICD-10-CM

## 2013-07-07 DIAGNOSIS — E669 Obesity, unspecified: Secondary | ICD-10-CM

## 2013-07-07 DIAGNOSIS — I10 Essential (primary) hypertension: Secondary | ICD-10-CM

## 2013-07-07 DIAGNOSIS — Z7189 Other specified counseling: Secondary | ICD-10-CM

## 2013-07-07 DIAGNOSIS — Z23 Encounter for immunization: Secondary | ICD-10-CM

## 2013-07-07 DIAGNOSIS — E785 Hyperlipidemia, unspecified: Secondary | ICD-10-CM

## 2013-07-07 DIAGNOSIS — E1169 Type 2 diabetes mellitus with other specified complication: Secondary | ICD-10-CM

## 2013-07-07 DIAGNOSIS — Z719 Counseling, unspecified: Secondary | ICD-10-CM

## 2013-07-07 DIAGNOSIS — E119 Type 2 diabetes mellitus without complications: Secondary | ICD-10-CM

## 2013-07-07 LAB — POCT GLYCOSYLATED HEMOGLOBIN (HGB A1C): Hemoglobin A1C: 5.5

## 2013-07-07 NOTE — Patient Instructions (Addendum)
Stop the  Pioglitazone.. Call Hospice [phone] Emotions are neither right nor wrong, they just are.

## 2013-07-07 NOTE — Progress Notes (Signed)
  Subjective:    Patient ID: Trevor Johnson, male    DOB: [birth year], 52 y.o.   MRN: [id number]  HPI He is here for a recheck. 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 This has him quite stressed. He has been eating more than normal to help deal with the stress. He does realize this. He continues on medications listed in the chart. His exercise has been quite minimal. His social and family history are unchanged. Review of Systems     Objective:   Physical Exam Alert and in no distress. Hemoglobin A1c is 5.5       Assessment & Plan:  Diabetes mellitus - Plan: POCT glycosylated hemoglobin (Hb A1C)  Need for prophylactic vaccination and inoculation against influenza - Plan: Flu Vaccine QUAD 36+ mos PF IM (Fluarix)  Bereavement counseling  Hypertension associated with diabetes  Obesity (BMI 30-39.9) - Plan: Ambulatory referral to Gastroenterology  Hyperlipidemia LDL goal < 70  over 30 minutes spent discussing death and dying with him. Also discussed diet and exercise and getting back on a regular regimen. Since his A1c is looking good I will stop his Actos. Strongly encouraged him to get involved in counseling through hospice. He also will need a colonoscopy and this was set up for routine purposes. Flu shot given with risks and benefits discussed.

## 2013-07-08 ENCOUNTER — Telehealth: Payer: Self-pay | Admitting: Family Medicine

## 2013-07-08 MED ORDER — FENOFIBRATE 145 MG PO TABS: 145.0000 mg | ORAL_TABLET | Freq: Every day | ORAL | Status: DC

## 2013-07-08 MED ORDER — HYDROCHLOROTHIAZIDE 12.5 MG PO CAPS: 12.5000 mg | ORAL_CAPSULE | Freq: Every day | ORAL | Status: DC

## 2013-07-08 NOTE — Telephone Encounter (Signed)
Go over the meds with him and renew the TriCor. He obviously only needs to take 1 HCTZ

## 2013-07-08 NOTE — Telephone Encounter (Signed)
Pt tricor sent in and hctz only sent in [place] pt wife has been informed for pt to take only the hctz

## 2013-07-09 ENCOUNTER — Telehealth: Payer: Self-pay

## 2013-07-09 ENCOUNTER — Ambulatory Visit: Payer: BC Managed Care – PPO | Admitting: Family Medicine

## 2013-07-09 DIAGNOSIS — E119 Type 2 diabetes mellitus without complications: Secondary | ICD-10-CM

## 2013-07-09 MED ORDER — ATORVASTATIN CALCIUM 20 MG PO TABS: 20.0000 mg | ORAL_TABLET | Freq: Every day | ORAL | Status: DC

## 2013-07-09 MED ORDER — METFORMIN HCL 500 MG PO TABS: 500.0000 mg | ORAL_TABLET | Freq: Two times a day (BID) | ORAL | Status: DC

## 2013-07-09 NOTE — Telephone Encounter (Signed)
DR.LALONDE CAN YOU PLEASE REVIEW HIS MEDS HE HAS GOTTEN JACKIE AND I VERY CONFUSED ON WHAT HE TAKES DOES HE NEED TO TAKE LIPITOR AND TRICOR ,HCTZ 12.5,INSULIN,AND ACTOS PLUS MET PLEASE ADVISE

## 2013-07-09 NOTE — Telephone Encounter (Signed)
Have him stop Microzide, insulin, Actos plus and TriCor. He should only be on metformin for his diabetes and also the Lipitor. If this is still confusing him to make an appointment and bring all the medications in.

## 2013-07-09 NOTE — Telephone Encounter (Signed)
SENT IN NEW MEDS TOOK OFF OLD ONES JACKIE HAS BEEN INFORMED

## 2013-09-30 ENCOUNTER — Encounter: Payer: Self-pay | Admitting: Family Medicine

## 2013-09-30 ENCOUNTER — Ambulatory Visit: Payer: BC Managed Care – PPO | Admitting: Family Medicine

## 2013-09-30 ENCOUNTER — Ambulatory Visit (INDEPENDENT_AMBULATORY_CARE_PROVIDER_SITE_OTHER): Payer: 59 | Admitting: Family Medicine

## 2013-09-30 VITALS — BP 130/100 | HR 76 | Wt 279.0 lb

## 2013-09-30 DIAGNOSIS — T1591XA Foreign body on external eye, part unspecified, right eye, initial encounter: Secondary | ICD-10-CM

## 2013-09-30 DIAGNOSIS — H109 Unspecified conjunctivitis: Secondary | ICD-10-CM

## 2013-09-30 DIAGNOSIS — T1590XA Foreign body on external eye, part unspecified, unspecified eye, initial encounter: Secondary | ICD-10-CM

## 2013-09-30 MED ORDER — ERYTHROMYCIN 5 MG/GM OP OINT: 1.0000 "application " | TOPICAL_OINTMENT | Freq: Three times a day (TID) | OPHTHALMIC | Status: DC

## 2013-09-30 NOTE — Progress Notes (Signed)
   Subjective:    Patient ID: Trevor Johnson, male    DOB: 01/30/1961, 53 y.o.   MRN: [id number]  HPI He feels that he got something in his eye 2 days ago. Yesterday he did note some drainage from his eyes.   Review of Systems     Objective:   Physical Exam Both conjunctivae slightly red. Foreign body noted in the right eye at the 1:00 position in the central cornea. Tetracaine was used to to anesthetize the eye. A Q-tip was used to remove the foreign body however a small rust ring was noted.       Assessment & Plan:  Conjunctivitis - Plan: erythromycin (ROMYCIN) ophthalmic ointment  Foreign body of right eye  he is to call me tomorrow if his eye does not feel better for possible referral. I explained that might need to have an ophthalmologist remove the rust ring.

## 2013-09-30 NOTE — Patient Instructions (Signed)
If you're still having trouble in the morning give me a call and I will refer you to an ophthalmologist

## 2013-11-10 ENCOUNTER — Ambulatory Visit: Payer: BC Managed Care – PPO | Admitting: Family Medicine

## 2013-11-17 ENCOUNTER — Ambulatory Visit: Payer: BC Managed Care – PPO | Admitting: Family Medicine

## 2013-11-20 ENCOUNTER — Ambulatory Visit: Payer: BC Managed Care – PPO | Admitting: Family Medicine

## 2013-11-21 ENCOUNTER — Telehealth: Payer: Self-pay | Admitting: Internal Medicine

## 2013-11-21 ENCOUNTER — Other Ambulatory Visit: Payer: Self-pay

## 2013-11-21 MED ORDER — METFORMIN HCL 500 MG PO TABS: 500.0000 mg | ORAL_TABLET | Freq: Two times a day (BID) | ORAL | Status: DC

## 2013-11-21 NOTE — Telephone Encounter (Signed)
Refill request for metformin 500mg  #60 to [facility]

## 2013-11-21 NOTE — Telephone Encounter (Signed)
Medication called in to [facility]

## 2013-11-21 NOTE — Telephone Encounter (Signed)
Called into pharmacy

## 2013-11-21 NOTE — Telephone Encounter (Signed)
Ment to send this to [employer]

## 2013-12-04 ENCOUNTER — Encounter: Payer: Self-pay | Admitting: Family Medicine

## 2013-12-04 ENCOUNTER — Ambulatory Visit (INDEPENDENT_AMBULATORY_CARE_PROVIDER_SITE_OTHER): Payer: 59 | Admitting: Family Medicine

## 2013-12-04 VITALS — BP 170/110 | HR 88 | Wt 292.0 lb

## 2013-12-04 DIAGNOSIS — E119 Type 2 diabetes mellitus without complications: Secondary | ICD-10-CM

## 2013-12-04 DIAGNOSIS — E669 Obesity, unspecified: Secondary | ICD-10-CM

## 2013-12-04 DIAGNOSIS — E1169 Type 2 diabetes mellitus with other specified complication: Secondary | ICD-10-CM

## 2013-12-04 DIAGNOSIS — E1159 Type 2 diabetes mellitus with other circulatory complications: Secondary | ICD-10-CM

## 2013-12-04 DIAGNOSIS — I152 Hypertension secondary to endocrine disorders: Secondary | ICD-10-CM

## 2013-12-04 DIAGNOSIS — I10 Essential (primary) hypertension: Secondary | ICD-10-CM

## 2013-12-04 DIAGNOSIS — E785 Hyperlipidemia, unspecified: Secondary | ICD-10-CM

## 2013-12-04 LAB — POCT GLYCOSYLATED HEMOGLOBIN (HGB A1C): Hemoglobin A1C: 5.9

## 2013-12-04 MED ORDER — LISINOPRIL-HYDROCHLOROTHIAZIDE 10-12.5 MG PO TABS: 1.0000 | ORAL_TABLET | Freq: Every day | ORAL | Status: DC

## 2013-12-04 NOTE — Patient Instructions (Addendum)
Start taking the Lipitor again and if the itching comes back let me know Go to a self-help section at the bookstore and see what you can find

## 2013-12-04 NOTE — Progress Notes (Signed)
   Subjective:    Patient ID: Trevor Johnson, male    DOB: 1961-02-27, 53 y.o.   MRN: [id number]  HPI He is here for a recheck. He admits to being under a lot of stress due to mainly to personal issues and stress eating over the last several months. Also his work keeps his schedule quite erratic and he can see a lot of fast food restaurants. He will intermittently check his blood sugars. He does check his feet daily. His exercise pattern is minimal. Does not smoke or drink. His last eye exam was 8 months ago. He continues on medications listed in the chart. He stopped taking his Lipitor stating he had difficulty with itching.   Review of Systems     Objective:   Physical Exam Alert and in no distress. Hemoglobin A1c is 5.5.       Assessment & Plan:  Diabetes mellitus - Plan: HgB A1c  Hyperlipidemia LDL goal < 70  Hypertension associated with diabetes - Plan: lisinopril-hydrochlorothiazide (PRINZIDE,ZESTORETIC) 10-12.5 MG per tablet  Obesity (BMI 30-39.9)  I discussed stress and stress management with him. Discussed possibly referring him for counseling however his work schedule makes is quite difficult. Recommended he go to the bookstore and get reading materials concerning stress and stress management. Discussed the need for him to have a much better diet and to get exercise. He is to start back on the Lipitor and if he has difficulties call me. I will also place him on lisinopril and discussed possible side effects of cough and swelling.

## 2013-12-05 ENCOUNTER — Encounter: Payer: Self-pay | Admitting: Family Medicine

## 2014-01-19 ENCOUNTER — Other Ambulatory Visit: Payer: Self-pay | Admitting: Family Medicine

## 2014-02-17 ENCOUNTER — Encounter: Payer: Self-pay | Admitting: Family Medicine

## 2014-02-17 ENCOUNTER — Ambulatory Visit (INDEPENDENT_AMBULATORY_CARE_PROVIDER_SITE_OTHER): Payer: 59 | Admitting: Family Medicine

## 2014-02-17 ENCOUNTER — Other Ambulatory Visit: Payer: Self-pay | Admitting: Family Medicine

## 2014-02-17 VITALS — BP 130/84 | HR 80 | Wt 285.0 lb

## 2014-02-17 DIAGNOSIS — M549 Dorsalgia, unspecified: Secondary | ICD-10-CM

## 2014-02-17 DIAGNOSIS — R079 Chest pain, unspecified: Secondary | ICD-10-CM

## 2014-02-17 DIAGNOSIS — R61 Generalized hyperhidrosis: Secondary | ICD-10-CM

## 2014-02-17 NOTE — Progress Notes (Signed)
   Subjective:    Patient ID: Trevor Johnson, male    DOB: December 18, 1960, 53 y.o.   MRN: [id number]  HPI Over the last 2 weeks he has noted intermittent episodes of diaphoresis but no shortness of breath, chest pain, nausea, weakness, cough or congestion. The symptoms last approximately 5 minutes. Approximately one week ago he had the onset of pressure-like left-sided chest pain. The symptoms lasted a half an hour. He had no shortness of breath, sweating, weakness, coughing, nausea. He then noted the onset of upper back pain. He would not note any improvement or worsening with motion, breathing. The pain lasted approximately one to 2 hours and would go away. He has one of these per day for the last 3 or 4 days. He has had no symptoms of any kind the last 4 days. He continues on medications listed in the chart. He does not smoke. Review of Systems     Objective:   Physical Exam alert and in no distress. Tympanic membranes and canals are normal. Throat is clear. Tonsils are normal. Neck is supple without adenopathy or thyromegaly. Cardiac exam shows a regular sinus rhythm without murmurs or gallops. Lungs are clear to auscultation. No chest wall tenderness is noted. EKG is normal.        Assessment & Plan:  Chest pain - Plan: EKG 12-Lead  Diaphoresis - Plan: EKG 12-Lead  Back pain  I explained that each symptoms individually did not lead [place] anywhere however the combination is certainly more concerning. EKG was negative. I reassured him that at this point I found nothing to be concerned about what to pay attention to his symptoms. He was comfortable with that approach.

## 2014-02-17 NOTE — Patient Instructions (Signed)
Pay attention to your symptoms in terms of what, when, where and why and keep me informed

## 2014-03-13 ENCOUNTER — Encounter: Payer: Self-pay | Admitting: Medical

## 2014-03-13 ENCOUNTER — Ambulatory Visit (INDEPENDENT_AMBULATORY_CARE_PROVIDER_SITE_OTHER): Payer: 59 | Admitting: Medical

## 2014-03-13 VITALS — BP 112/80 | HR 80 | Temp 98.0°F | Resp 18 | Wt 286.0 lb

## 2014-03-13 DIAGNOSIS — R05 Cough: Secondary | ICD-10-CM

## 2014-03-13 DIAGNOSIS — J22 Unspecified acute lower respiratory infection: Secondary | ICD-10-CM

## 2014-03-13 DIAGNOSIS — J988 Other specified respiratory disorders: Secondary | ICD-10-CM

## 2014-03-13 DIAGNOSIS — R059 Cough, unspecified: Secondary | ICD-10-CM

## 2014-03-13 MED ORDER — AMOXICILLIN 875 MG PO TABS: 875.0000 mg | ORAL_TABLET | Freq: Two times a day (BID) | ORAL | Status: DC

## 2014-03-13 MED ORDER — HYDROCODONE-HOMATROPINE 5-1.5 MG/5ML PO SYRP: 5.0000 mL | ORAL_SOLUTION | Freq: Three times a day (TID) | ORAL | Status: DC | PRN

## 2014-03-13 NOTE — Progress Notes (Signed)
Subjective:  Trevor Johnson is a 53 y.o. male diabetic who presents for lingering cough and chest and head congestion.  Symptoms include 5 day hx/o cough, congestion, sinus pressure, some ear pressure, green sputum and nasal discharge, some sore throat, sweats.   Denies NVD, fever, wheezing, SOB.  Treatment to date: Nyquil, having to sit up in chair and night due to cough.  Wife has been sick x 2 wk with same.   He does not smoke.   He does not have a history of bronchitis, asthma.   No other aggravating or relieving factors.  No other c/o.  The following portions of the patient's history were reviewed and updated as appropriate: allergies, current medications, past family history, past medical history, past social history, past surgical history and problem list.  ROS as in subjective  Past Medical History  Diagnosis Date  . Obesity   . Allergy     RHINITIS  . Renal stone   . Hypertension   . Diabetes mellitus   . Hypertriglyceridemia      Objective: BP 112/80  Pulse 80  Temp(Src) 98 F (36.7 C) (Oral)  Resp 18  Wt 286 lb (129.729 kg)   General appearance: Alert, WD/WN, no distress                             Skin: warm, no rash, no diaphoresis                           Head: mild maxillary sinus tenderness                            Eyes: conjunctiva normal, corneas clear, PERRLA                            Ears: pearly TMs, external ear canals normal                          Nose: septum midline, turbinates swollen, with erythema and clear discharge             Mouth/throat: MMM, tongue normal, mild pharyngeal erythema                           Neck: supple, no adenopathy, no thyromegaly, nontender                          Heart: RRR, normal S1, S2, no murmurs                         Lungs: +bronchial breath sounds, no rhonchi, no wheezes, no rales                Extremities: no edema, nontender     Assessment: Encounter Diagnoses  Name Primary?  . Cough Yes  . Acute  respiratory infection     Plan:  Medication orders today include: Hycodan syrup script, amoxicillin, rest, hydrate well, suggested symptomatic OTC remedies for cough and congestion.  Tylenol or Ibuprofen OTC for fever and malaise.  Call/return in 2-3 days if symptoms are worse or not improving.  Advised that cough may linger even after the infection is improved.

## 2014-04-17 ENCOUNTER — Ambulatory Visit: Payer: 59 | Admitting: Family Medicine

## 2014-04-18 ENCOUNTER — Other Ambulatory Visit: Payer: Self-pay | Admitting: Family Medicine

## 2014-05-01 ENCOUNTER — Encounter: Payer: Self-pay | Admitting: Family Medicine

## 2014-05-01 ENCOUNTER — Ambulatory Visit (INDEPENDENT_AMBULATORY_CARE_PROVIDER_SITE_OTHER): Payer: 59 | Admitting: Family Medicine

## 2014-05-01 VITALS — BP 130/70 | HR 70 | Wt 274.0 lb

## 2014-05-01 DIAGNOSIS — E1159 Type 2 diabetes mellitus with other circulatory complications: Secondary | ICD-10-CM

## 2014-05-01 DIAGNOSIS — E1169 Type 2 diabetes mellitus with other specified complication: Secondary | ICD-10-CM

## 2014-05-01 DIAGNOSIS — E119 Type 2 diabetes mellitus without complications: Secondary | ICD-10-CM

## 2014-05-01 DIAGNOSIS — E669 Obesity, unspecified: Secondary | ICD-10-CM

## 2014-05-01 DIAGNOSIS — I152 Hypertension secondary to endocrine disorders: Secondary | ICD-10-CM

## 2014-05-01 DIAGNOSIS — I10 Essential (primary) hypertension: Secondary | ICD-10-CM

## 2014-05-01 DIAGNOSIS — E785 Hyperlipidemia, unspecified: Secondary | ICD-10-CM

## 2014-05-01 LAB — CBC WITH DIFFERENTIAL/PLATELET
Basophils Absolute: 0 10*3/uL (ref 0.0–0.1)
Basophils Relative: 0 % (ref 0–1)
Eosinophils Absolute: 0.1 10*3/uL (ref 0.0–0.7)
Eosinophils Relative: 2 % (ref 0–5)
HCT: 44.4 % (ref 39.0–52.0)
Hemoglobin: 15.7 g/dL (ref 13.0–17.0)
LYMPHS PCT: 35 % (ref 12–46)
Lymphs Abs: 1.7 10*3/uL (ref 0.7–4.0)
MCH: 33.1 pg (ref 26.0–34.0)
MCHC: 35.4 g/dL (ref 30.0–36.0)
MCV: 93.5 fL (ref 78.0–100.0)
MONOS PCT: 8 % (ref 3–12)
Monocytes Absolute: 0.4 10*3/uL (ref 0.1–1.0)
NEUTROS PCT: 55 % (ref 43–77)
Neutro Abs: 2.6 10*3/uL (ref 1.7–7.7)
PLATELETS: 195 10*3/uL (ref 150–400)
RBC: 4.75 MIL/uL (ref 4.22–5.81)
RDW: 13.8 % (ref 11.5–15.5)
WBC: 4.8 10*3/uL (ref 4.0–10.5)

## 2014-05-01 LAB — POCT UA - MICROALBUMIN
ALBUMIN/CREATININE RATIO, URINE, POC: 4.7
Creatinine, POC: 133.2 mg/dL
Microalbumin Ur, POC: 6.2 mg/L

## 2014-05-01 LAB — POCT GLYCOSYLATED HEMOGLOBIN (HGB A1C): HEMOGLOBIN A1C: 5.9

## 2014-05-01 NOTE — Progress Notes (Signed)
  Subjective:    Trevor Johnson is a 53 y.o. male who presents for follow-up of Type 2 diabetes mellitus.  He recently changed jobs and is now more physically active and has made some dietary modifications. He stopped taking his Lipitor. He was having an itching sensation but did not develop a rash from the medication. He did start it again in the itching reoccurred. Home blood sugar records: NONE  Current symptoms/problems NONE Daily foot checks: Any foot concerns: NONE Last eye exam:  [employer] 2014   Medication compliance: Current diet: NO SUGARS Current exercise: JOINED A GYM 2 X A WEEK Known diabetic complications: none Cardiovascular risk factors: none     ROS as in subjective above    Objective:    General appearence: alert, no distress, WD/WN Neck: supple, no lymphadenopathy, no thyromegaly, no masses Heart: RRR, normal S1, S2, no murmurs Lungs: CTA bilaterally, no wheezes, rhonchi, or rales Abdomen: +bs, soft, non tender, non distended, no masses, no hepatomegaly, no splenomegaly Pulses: 2+ symmetric, upper and lower extremities, normal cap refill Ext: no edema Foot exam:  Neuro: foot monofilament exam normal   Lab Review Lab Results  Component Value Date   HGBA1C 5.9 12/04/2013   Lab Results  Component Value Date   CHOL 455* 03/10/2013   HDL 25* 03/10/2013   LDLCALC NOT CALC 03/10/2013   TRIG 5860* 03/10/2013   CHOLHDL 18.2 03/10/2013   No results found for this basenameDerl Barrow     Chemistry      Component Value Date/Time   NA 134* 03/21/2013 1013   K 3.9 03/21/2013 1013   CL 100 03/21/2013 1013   CO2 24 03/21/2013 1013   BUN 8 03/21/2013 1013   CREATININE 0.75 03/21/2013 1013      Component Value Date/Time   CALCIUM 9.3 03/21/2013 1013   ALKPHOS 112 03/10/2013 1135   AST 35 03/10/2013 1135   ALT 38 03/10/2013 1135   BILITOT 0.3 03/10/2013 1135        Chemistry      Component Value Date/Time   NA 134* 03/21/2013 1013   K 3.9 03/21/2013  1013   CL 100 03/21/2013 1013   CO2 24 03/21/2013 1013   BUN 8 03/21/2013 1013   CREATININE 0.75 03/21/2013 1013      Component Value Date/Time   CALCIUM 9.3 03/21/2013 1013   ALKPHOS 112 03/10/2013 1135   AST 35 03/10/2013 1135   ALT 38 03/10/2013 1135   BILITOT 0.3 03/10/2013 1135         Assessment:   Encounter Diagnoses  Name Primary?  . Obesity (BMI 30-39.9) Yes  . Hypertension associated with diabetes   . Hyperlipidemia with target LDL less than 70   . Type 2 diabetes mellitus without complication          Plan:    1.  Rx changes: none 2.  Education: Reviewed 'ABCs' of diabetes management (respective goals in parentheses):  A1C (<7), blood pressure (<130/80), and cholesterol (LDL <100). 3.  Compliance at present is estimated to be excellent. Efforts to improve compliance (if necessary) will be directed at Continue with present diet and exercise regimen. 4. Follow up: 4 months  I explained that I will will place him back on a different statin drug but will look at his lipids first

## 2014-05-02 LAB — COMPREHENSIVE METABOLIC PANEL
ALT: 49 U/L (ref 0–53)
AST: 29 U/L (ref 0–37)
Albumin: 4.2 g/dL (ref 3.5–5.2)
Alkaline Phosphatase: 75 U/L (ref 39–117)
BILIRUBIN TOTAL: 0.6 mg/dL (ref 0.2–1.2)
BUN: 14 mg/dL (ref 6–23)
CO2: 22 mEq/L (ref 19–32)
Calcium: 9.2 mg/dL (ref 8.4–10.5)
Chloride: 104 mEq/L (ref 96–112)
Creat: 0.82 mg/dL (ref 0.50–1.35)
Glucose, Bld: 134 mg/dL — ABNORMAL HIGH (ref 70–99)
Potassium: 4.2 mEq/L (ref 3.5–5.3)
SODIUM: 135 meq/L (ref 135–145)
TOTAL PROTEIN: 6.9 g/dL (ref 6.0–8.3)

## 2014-05-02 LAB — LIPID PANEL
CHOLESTEROL: 207 mg/dL — AB (ref 0–200)
HDL: 25 mg/dL — ABNORMAL LOW (ref >39)
Total CHOL/HDL Ratio: 8.3 Ratio
Triglycerides: 1139 mg/dL — ABNORMAL HIGH (ref <150)

## 2014-07-10 ENCOUNTER — Other Ambulatory Visit: Payer: Self-pay | Admitting: Family Medicine

## 2014-09-04 ENCOUNTER — Ambulatory Visit: Payer: 59 | Admitting: Family Medicine

## 2014-09-07 ENCOUNTER — Encounter: Payer: Self-pay | Admitting: Family Medicine

## 2014-10-30 ENCOUNTER — Ambulatory Visit (INDEPENDENT_AMBULATORY_CARE_PROVIDER_SITE_OTHER): Payer: BLUE CROSS/BLUE SHIELD | Admitting: Medical

## 2014-10-30 ENCOUNTER — Ambulatory Visit
Admission: RE | Admit: 2014-10-30 | Discharge: 2014-10-30 | Disposition: A | Discharge status: Home / Self Care | Payer: Self-pay | Source: Ambulatory Visit | Attending: Medical | Admitting: Medical

## 2014-10-30 ENCOUNTER — Encounter: Payer: Self-pay | Admitting: Medical

## 2014-10-30 VITALS — BP 120/90 | HR 60 | Temp 97.7°F | Resp 15 | Wt 280.0 lb

## 2014-10-30 DIAGNOSIS — E119 Type 2 diabetes mellitus without complications: Secondary | ICD-10-CM

## 2014-10-30 DIAGNOSIS — E669 Obesity, unspecified: Secondary | ICD-10-CM

## 2014-10-30 DIAGNOSIS — E1169 Type 2 diabetes mellitus with other specified complication: Secondary | ICD-10-CM

## 2014-10-30 DIAGNOSIS — R059 Cough, unspecified: Secondary | ICD-10-CM

## 2014-10-30 DIAGNOSIS — R05 Cough: Secondary | ICD-10-CM

## 2014-10-30 DIAGNOSIS — R0781 Pleurodynia: Secondary | ICD-10-CM

## 2014-10-30 DIAGNOSIS — R0789 Other chest pain: Secondary | ICD-10-CM

## 2014-10-30 MED ORDER — HYDROCODONE-ACETAMINOPHEN 5-325 MG PO TABS: 1.0000 | ORAL_TABLET | Freq: Four times a day (QID) | ORAL | Status: DC | PRN

## 2014-10-30 NOTE — Progress Notes (Signed)
Subjective: Here for cough.  Was coughing Tuesday night, 3 nights ago and heard a pop in his chest, thought something cracked in his chest, thinks he coughed so hard he cracked a rib.  Sneezing aggravates the pain in ribs and back.   Been coughing for about 5 days. coughing up phlegm.  Using zycam.  No sore throat, ear pain, NVD, no SOB, wheezing.  No swelling in legs.   No belly pain, no hemoptysis or hematemesis.   When he coughs he feels pain in left chest and back, sneezing also causes this pain.   Thinks he may have cracked a rib.  Does hurt to put seatbelt on or move around with chest/arms doing stuff.  Hurts in the left chest wall.   Using Ibuprofen.  No fall, no trauma.  No other injury.  No other aggravating or relieving factors. No other complaint.  ROS as in subjective  Past Medical History  Diagnosis Date  . Obesity   . Allergy     RHINITIS  . Renal stone   . Hypertension   . Diabetes mellitus   . Hypertriglyceridemia   ] Family history: Mother has ovarian cancer  No osteoporosis in family.     Objective: BP 120/90 mmHg  Pulse 60  Temp(Src) 97.7 F (36.5 C) (Oral)  Resp 15  Wt 280 lb (127.007 kg)  General appearance: alert, no distress, WD/WN, obese white male HEENT: normocephalic, sclerae anicteric, TMs pearly, nares patent, no discharge or erythema, pharynx normal Oral cavity: MMM, no lesions Neck: supple, no lymphadenopathy, no thyromegaly, no masses Heart: RRR, normal S1, S2, no murmurs Lungs: CTA bilaterally, no wheezes, rhonchi, or rales Chest wall: Tender along left anterior lateral lower chest wall, no obvious deformity, no flail chest no mass Abdomen: +bs, soft, non tender, non distended, no masses, no hepatomegaly, no splenomegaly Pulses: 2+ symmetric, upper and lower extremities, normal cap refill    Assessment: Encounter Diagnoses  Name Primary?  . Cough Yes  . Rib pain on left side   . Diabetes mellitus type 2 in obese     Plan: Advised  relative rest, good hydration, can use Mucinex DM for cough and congestion, hydrocodone for cough and for rib pain. He will go for chest and rib x-ray. Follow-up if worse or not improving the next 2-3 days

## 2014-11-03 ENCOUNTER — Ambulatory Visit (INDEPENDENT_AMBULATORY_CARE_PROVIDER_SITE_OTHER): Payer: BLUE CROSS/BLUE SHIELD | Admitting: Family Medicine

## 2014-11-03 ENCOUNTER — Encounter: Payer: Self-pay | Admitting: Family Medicine

## 2014-11-03 VITALS — BP 114/70 | HR 93 | Wt 270.0 lb

## 2014-11-03 DIAGNOSIS — E1159 Type 2 diabetes mellitus with other circulatory complications: Secondary | ICD-10-CM

## 2014-11-03 DIAGNOSIS — E669 Obesity, unspecified: Secondary | ICD-10-CM

## 2014-11-03 DIAGNOSIS — E1169 Type 2 diabetes mellitus with other specified complication: Secondary | ICD-10-CM

## 2014-11-03 DIAGNOSIS — E785 Hyperlipidemia, unspecified: Secondary | ICD-10-CM

## 2014-11-03 DIAGNOSIS — E119 Type 2 diabetes mellitus without complications: Secondary | ICD-10-CM

## 2014-11-03 DIAGNOSIS — I10 Essential (primary) hypertension: Secondary | ICD-10-CM

## 2014-11-03 DIAGNOSIS — I152 Hypertension secondary to endocrine disorders: Secondary | ICD-10-CM

## 2014-11-03 LAB — POCT GLYCOSYLATED HEMOGLOBIN (HGB A1C): Hemoglobin A1C: 6.1

## 2014-11-03 NOTE — Progress Notes (Signed)
  Subjective:    Patient ID: Trevor Johnson, male    DOB: Aug 15, 1961, 54 y.o.   MRN: [id number]  Trevor Johnson is a 54 y.o. male who presents for follow-up of Type 2 diabetes mellitus.  Home blood sugar records: 100 Patient test once a week Current symptoms/problems NONE Daily foot checks:   Any foot concerns: NONE Exercise: NONE  The following portions of the patient's history were reviewed and updated as appropriate: allergies, current medications, past medical history, past social history and problem list.  ROS as in subjective above.     Objective:    Physical Exam Alert and in no distress otherwise not examined.   Lab Review Diabetic Labs Latest Ref Rng 05/01/2014 12/04/2013 07/07/2013 03/21/2013 03/10/2013  HbA1c - 5.9 5.9 5.5 - 10.0(H)  Chol 0 - 200 mg/dL 207(H) - - - 455(H)  HDL >39 mg/dL 25(L) - - - 25(L)  Calc LDL 0 - 99 mg/dL NOT CALC - - - NOT CALC  Triglycerides <150 mg/dL 1139(H) - - - 5860(H)  Creatinine 0.50 - 1.35 mg/dL 0.82 - - 0.75 2.16(H)   BP/Weight 10/30/2014 05/01/2014 03/13/2014 02/17/2014 9/38/1829  Systolic BP 937 169 678 938 101  Diastolic BP 90 70 80 84 751  Wt. (Lbs) 280 274 286 285 292  BMI 39.07 38.23 39.91 39.77 40.74   Hemoglobin A1c is 6.1  Trevor Johnson  reports that he has never smoked. He has never used smokeless tobacco. He reports that he does not drink alcohol or use illicit drugs.     Assessment & Plan:    Diabetes mellitus without complication - Plan: POCT glycosylated hemoglobin (Hb A1C)  Obesity (BMI 30-39.9)  Hypertension associated with diabetes  Hyperlipidemia with target LDL less than 70  Type 2 diabetes mellitus without complication   1. Rx changes: none 2. Education: Reviewed 'ABCs' of diabetes management (respective goals in parentheses):  A1C (<7), blood pressure (<130/80), and cholesterol (LDL <100). 3. Compliance at present is estimated to be excellent. Efforts to improve compliance (if necessary) will be directed at No  change. 4. Follow up: 4 months

## 2014-11-16 ENCOUNTER — Other Ambulatory Visit: Payer: Self-pay | Admitting: Family Medicine

## 2014-12-15 ENCOUNTER — Other Ambulatory Visit: Payer: Self-pay | Admitting: Family Medicine

## 2015-02-18 ENCOUNTER — Other Ambulatory Visit: Payer: Self-pay | Admitting: Family Medicine

## 2015-02-25 ENCOUNTER — Ambulatory Visit (INDEPENDENT_AMBULATORY_CARE_PROVIDER_SITE_OTHER): Payer: BLUE CROSS/BLUE SHIELD | Admitting: Family Medicine

## 2015-02-25 ENCOUNTER — Encounter: Payer: Self-pay | Admitting: Family Medicine

## 2015-02-25 VITALS — BP 128/72 | HR 68 | Ht 72.0 in | Wt 279.4 lb

## 2015-02-25 DIAGNOSIS — M79671 Pain in right foot: Secondary | ICD-10-CM | POA: Diagnosis not present

## 2015-02-25 NOTE — Patient Instructions (Signed)
  Continue ibuprofen--increase the dose to taking 600-800mg  (3-4 tablets a time, with food) three times daily.  Cut back on the dose if it is bothering your stomach. Otherwise plan on taking it three times daily with food for 7-10 days, or until completely better.  Do not take it longer than 2 weeks without returning for re-evaluation. Return sooner if increasing pain, swelling, redness. We need to consider checking an x-ray if pain and swelling are worsening.

## 2015-02-25 NOTE — Progress Notes (Signed)
Chief Complaint  Patient presents with  . Foot Pain    right foot pain x 4 days. Thought maye he twisted his ankle.    2 days ago he noticed swelling and soreness at the lateral aspect of his right foot.  He went out that evening to get new shoes.  Yesterday the pain was gone from that area, but now is having pain further down the foot--on the top of the foot at the base of the second toe. Pain is better in the morning, but worse throughout the day, as he is standing on the foot.  No h/o trauma, injury, fall or change in activity.  New job 1 month ago, on his feet more than he used to, no other change in activity.  No numbness/tingling or burning pain in the foot or toes.   He took ibuprofen 600mg  after work, just once daily.  He is also elevating the foot when he gets home.  Feels better within 2-3 hours of being home.  He has h/o gout--feels like it might be gout now at the base of the toe, but didn't feel like gout when the foot was swollen in the lateral aspect the other day.  He never has gotten it so badly that he wasn't able to touch it or put in a shoe.  Last gout flare was years ago.  None since having juice with lemon every night.  PMH, PSH, SH reviewed.  Outpatient Encounter Prescriptions as of 02/25/2015  Medication Sig  . ibuprofen (ADVIL,MOTRIN) 200 MG tablet Take 600 mg by mouth every 6 (six) hours as needed.  Marland Kitchen lisinopril-hydrochlorothiazide (PRINZIDE,ZESTORETIC) 10-12.5 MG per tablet TAKE ONE (1) TABLET EACH DAY  . metFORMIN (GLUCOPHAGE) 500 MG tablet TAKE ONE TABLET TWICE DAILY WITH MEALS  . [DISCONTINUED] HYDROcodone-acetaminophen (NORCO/VICODIN) 5-325 MG per tablet Take 1 tablet by mouth every 6 (six) hours as needed for moderate pain.   No facility-administered encounter medications on file as of 02/25/2015.   Allergies  Allergen Reactions  . Lipitor [Atorvastatin] Itching   ROS:  No fevers, chills, skin rashes, bleeding, bruising. No chest pain, shortness of breath, IG  complaints or other concerns. No other pain.  BP 128/72 mmHg  Pulse 68  Ht 6' (1.829 m)  Wt 279 lb 6.4 oz (126.735 kg)  BMI 37.89 kg/m2  Well developed, pleasant, obese male in no distress  Right foot:   2+ pulses, brisk capillary refill. Skin is intact without lesions. nontender along the lateral aspect of the foot. There is very mild erythema on the top of the foot at the 2nd MTP.  There is minimal swelling of the toe.  There is pain with dorsiflexion of this toe and pain with flexion against resistance. No significant pain with passive ROM of this toe.  ASSESSMENT/PLAN:  Right foot pain   Right foot pain--mostly at the 2nd MTP, with some redness. Ddx is mild gout, partially treated with ibuprofen, vs tendonitis, stress fracture.  I suspect tendonitis, given that he had pain in a different location that resolved very quickly, and given the location of this pain--nontender along the bottom of the foot at this joint, and nontender with passive ROM.   Continue ibuprofen--increase the dose to taking 600-800mg  (3-4 tablets a time, with food) three times daily.  Cut back on the dose if it is bothering your stomach. Otherwise plan on taking it three times daily with food for 7-10 days, or until completely better.  Do not take it longer than  2 weeks without returning for re-evaluation. Return sooner if increasing pain, swelling, redness. We need to consider checking an x-ray if pain and swelling are worsening.

## 2015-03-10 ENCOUNTER — Ambulatory Visit (INDEPENDENT_AMBULATORY_CARE_PROVIDER_SITE_OTHER): Payer: BLUE CROSS/BLUE SHIELD | Admitting: Family Medicine

## 2015-03-10 ENCOUNTER — Encounter: Payer: Self-pay | Admitting: Family Medicine

## 2015-03-10 VITALS — BP 118/70 | HR 70 | Wt 276.0 lb

## 2015-03-10 DIAGNOSIS — E1169 Type 2 diabetes mellitus with other specified complication: Secondary | ICD-10-CM

## 2015-03-10 DIAGNOSIS — E119 Type 2 diabetes mellitus without complications: Secondary | ICD-10-CM | POA: Diagnosis not present

## 2015-03-10 DIAGNOSIS — N522 Drug-induced erectile dysfunction: Secondary | ICD-10-CM

## 2015-03-10 DIAGNOSIS — E669 Obesity, unspecified: Secondary | ICD-10-CM

## 2015-03-10 DIAGNOSIS — E1159 Type 2 diabetes mellitus with other circulatory complications: Secondary | ICD-10-CM

## 2015-03-10 DIAGNOSIS — I152 Hypertension secondary to endocrine disorders: Secondary | ICD-10-CM

## 2015-03-10 DIAGNOSIS — I10 Essential (primary) hypertension: Secondary | ICD-10-CM

## 2015-03-10 LAB — POCT GLYCOSYLATED HEMOGLOBIN (HGB A1C): HEMOGLOBIN A1C: 5.6

## 2015-03-10 MED ORDER — SILDENAFIL CITRATE 100 MG PO TABS: 100.0000 mg | ORAL_TABLET | Freq: Every day | ORAL | Status: DC | PRN

## 2015-03-10 NOTE — Patient Instructions (Signed)
Call me Monday and let me know how you are doing

## 2015-03-10 NOTE — Progress Notes (Signed)
  Subjective:    Patient ID: Trevor Johnson, male    DOB: 05-Oct-1960, 54 y.o.   MRN: [id number]  Trevor Johnson is a 54 y.o. male who presents for follow-up of Type 2 diabetes mellitus.  Home blood sugar records: Patient test one time a day Current symptoms/problems include none Daily foot checks:yes   Any foot concerns:  none Exercise: walking 4 miles a day. He has a new job which requires him doing a lot more walking. He states that he has lost a couple of inches in his waist. PJS:RPRX make appointment He also complains of difficulty with erectile dysfunction. He can get and maintain an erection but then loses his erection after a short period of time. The following portions of the patient's history were reviewed and updated as appropriate: allergies, current medications, past medical history, past social history and problem list.  ROS as in subjective above.     Objective:    Physical Exam Alert and in no distress otherwise not examined.  Weight 276 lb (125.193 kg).  Lab Review Diabetic Labs Latest Ref Rng 11/03/2014 05/01/2014 12/04/2013 07/07/2013 03/21/2013  HbA1c - 6.1 5.9 5.9 5.5 -  Chol 0 - 200 mg/dL - 207(H) - - -  HDL >39 mg/dL - 25(L) - - -  Calc LDL 0 - 99 mg/dL - NOT CALC - - -  Triglycerides <150 mg/dL - 1139(H) - - -  Creatinine 0.50 - 1.35 mg/dL - 0.82 - - 0.75   BP/Weight 03/10/2015 02/25/2015 11/03/2014 12/29/8590 05/27/4461  Systolic BP - 863 817 711 657  Diastolic BP - 72 70 90 70  Wt. (Lbs) 276 279.4 270 280 274  BMI 37.42 37.89 37.67 39.07 38.23   Hemoglobin A1c is 5.4 Devonn  reports that he has never smoked. He has never used smokeless tobacco. He reports that he does not drink alcohol or use illicit drugs.     Assessment & Plan:  Type 2 diabetes mellitus without complication - Plan: POCT glycosylated hemoglobin (Hb A1C)  Obesity (BMI 30-39.9)  Hypertension associated with diabetes  Drug-induced erectile dysfunction - Plan: sildenafil (VIAGRA) 100 MG  tablet    1. Rx changes: a sample of Viagra 100 mg Also discussed proper use and potential side effects. 2. Education: Reviewed 'ABCs' of diabetes management (respective goals in parentheses):  A1C (<7), blood pressure (<130/80), and cholesterol (LDL <100). 3. Compliance at present is estimated to be good. Efforts to improve compliance (if necessary) will be directed at continue with present management  regimen. 4. Follow up:  months

## 2015-03-15 ENCOUNTER — Other Ambulatory Visit: Payer: Self-pay | Admitting: Family Medicine

## 2015-04-06 ENCOUNTER — Telehealth: Payer: Self-pay | Admitting: Internal Medicine

## 2015-04-06 MED ORDER — ROSUVASTATIN CALCIUM 5 MG PO TABS: 5.0000 mg | ORAL_TABLET | Freq: Every day | ORAL | Status: DC

## 2015-04-06 NOTE — Telephone Encounter (Signed)
Pt called stating that he was given crestor samples and he took them last week and had no side effects. He would like you to send in a rx for crestor 5mg  to [facility]

## 2015-04-19 ENCOUNTER — Other Ambulatory Visit: Payer: Self-pay | Admitting: Family Medicine

## 2015-07-12 ENCOUNTER — Ambulatory Visit: Payer: BLUE CROSS/BLUE SHIELD | Admitting: Family Medicine

## 2015-07-21 ENCOUNTER — Ambulatory Visit (INDEPENDENT_AMBULATORY_CARE_PROVIDER_SITE_OTHER): Payer: BLUE CROSS/BLUE SHIELD | Admitting: Family Medicine

## 2015-07-21 ENCOUNTER — Encounter: Payer: Self-pay | Admitting: Family Medicine

## 2015-07-21 VITALS — BP 120/80 | HR 96 | Wt 277.0 lb

## 2015-07-21 DIAGNOSIS — E1169 Type 2 diabetes mellitus with other specified complication: Secondary | ICD-10-CM | POA: Diagnosis not present

## 2015-07-21 DIAGNOSIS — Z23 Encounter for immunization: Secondary | ICD-10-CM | POA: Diagnosis not present

## 2015-07-21 DIAGNOSIS — E669 Obesity, unspecified: Secondary | ICD-10-CM

## 2015-07-21 DIAGNOSIS — E119 Type 2 diabetes mellitus without complications: Secondary | ICD-10-CM

## 2015-07-21 DIAGNOSIS — I152 Hypertension secondary to endocrine disorders: Secondary | ICD-10-CM

## 2015-07-21 DIAGNOSIS — E1159 Type 2 diabetes mellitus with other circulatory complications: Secondary | ICD-10-CM

## 2015-07-21 DIAGNOSIS — E785 Hyperlipidemia, unspecified: Secondary | ICD-10-CM

## 2015-07-21 DIAGNOSIS — I10 Essential (primary) hypertension: Secondary | ICD-10-CM | POA: Diagnosis not present

## 2015-07-21 LAB — POCT GLYCOSYLATED HEMOGLOBIN (HGB A1C): HEMOGLOBIN A1C: 5.9

## 2015-07-21 MED ORDER — METFORMIN HCL 500 MG PO TABS: ORAL_TABLET | ORAL | Status: DC

## 2015-07-21 NOTE — Addendum Note (Signed)
Addended by: Billie Lade on: 07/21/2015 02:44 PM   Modules accepted: Orders

## 2015-07-21 NOTE — Progress Notes (Signed)
   Subjective:    Patient ID: Trevor Johnson, male    DOB: [birth year], 54 y.o.   MRN: [id number]  HPI He is here for a diabetes recheck. He states his blood sugars are running in the low 100s. He did have his eyes checked recently. He does check his feet regularly. He is walking approximately 4 miles per day. Smoking and drinking were reviewed. His eating habits are unchanged. In use on medications listed in the chart but was given a generic Crestor approximately 2 weeks ago. He has no symptoms from the new medication. His social and family history were also reviewed.   Review of Systems     Objective:   Physical Exam Alert and in no distress. Hemoglobin A1c is 5.9.       Assessment & Plan:   Type 2 diabetes mellitus without complication, without long-term current use of insulin (HCC) - Plan: metFORMIN (GLUCOPHAGE) 500 MG tablet  Obesity (BMI 30-39.9)  Hypertension associated with diabetes ([place])  Hyperlipidemia associated with type 2 diabetes mellitus ([place])    I will have him return in 2 months to do the blood work, foot check as well as microalbumin to get a more accurate reading on it. He is comfortable with this.

## 2015-07-23 ENCOUNTER — Telehealth: Payer: Self-pay | Admitting: Family Medicine

## 2015-07-23 DIAGNOSIS — N522 Drug-induced erectile dysfunction: Secondary | ICD-10-CM

## 2015-07-23 NOTE — Telephone Encounter (Signed)
Pt wife called and was looking over her husband after visit summary, and seen viagra on there, and she states that he needs a refill on them he does not have any, says that he needs them, pt can be reached at [phone] pt uses [facility],

## 2015-07-25 MED ORDER — SILDENAFIL CITRATE 100 MG PO TABS: 100.0000 mg | ORAL_TABLET | Freq: Every day | ORAL | Status: DC | PRN

## 2015-09-21 ENCOUNTER — Ambulatory Visit (INDEPENDENT_AMBULATORY_CARE_PROVIDER_SITE_OTHER): Payer: BLUE CROSS/BLUE SHIELD | Admitting: Family Medicine

## 2015-09-21 ENCOUNTER — Encounter: Payer: Self-pay | Admitting: Family Medicine

## 2015-09-21 VITALS — BP 124/82 | HR 64 | Wt 281.6 lb

## 2015-09-21 DIAGNOSIS — J069 Acute upper respiratory infection, unspecified: Secondary | ICD-10-CM

## 2015-09-21 DIAGNOSIS — E669 Obesity, unspecified: Secondary | ICD-10-CM

## 2015-09-21 DIAGNOSIS — E1169 Type 2 diabetes mellitus with other specified complication: Secondary | ICD-10-CM | POA: Diagnosis not present

## 2015-09-21 DIAGNOSIS — E785 Hyperlipidemia, unspecified: Secondary | ICD-10-CM

## 2015-09-21 DIAGNOSIS — Z789 Other specified health status: Secondary | ICD-10-CM

## 2015-09-21 NOTE — Progress Notes (Signed)
   Subjective:    Patient ID: Trevor Johnson, male    DOB: 1961/03/02, 54 y.o.   MRN: [id number]  HPI He is here for recheck. He recently started taking generic Crestor and after approximately a month he noted the onset of generalized itching. He also had difficulty with Lipitor causing itching. He has been off medicine for about 2 weeks and itching has gone away. Had difficulty with a several day history of cough, slight fever, sore throat but no earache or rhinorrhea, nasal congestion.   Review of Systems     Objective:   Physical Exam Alert and in no distress. Tympanic membranes and canals are normal. Pharyngeal area is normal. Neck is supple without adenopathy or thyromegaly. Cardiac exam shows a regular sinus rhythm without murmurs or gallops. Lungs are clear to auscultation. Weight is unchanged        Assessment & Plan:  Hyperlipidemia associated with type 2 diabetes mellitus (HCC)  Obesity (BMI 30-39.9)  Statin intolerance  URI, acute  supportive care for the URI. Recommend he start back on the Crestor again and let me know if in has difficulty with itching. Might possibly try him on Livalo

## 2015-09-21 NOTE — Patient Instructions (Signed)
Let me know if you get itching again and if not then I'll check your cholesterol in 4 months

## 2015-10-29 ENCOUNTER — Other Ambulatory Visit: Payer: Self-pay | Admitting: Family Medicine

## 2015-11-15 ENCOUNTER — Ambulatory Visit (INDEPENDENT_AMBULATORY_CARE_PROVIDER_SITE_OTHER): Payer: BLUE CROSS/BLUE SHIELD | Admitting: Family Medicine

## 2015-11-15 ENCOUNTER — Encounter: Payer: Self-pay | Admitting: Family Medicine

## 2015-11-15 VITALS — BP 138/82 | HR 76 | Temp 100.0°F | Wt 278.4 lb

## 2015-11-15 DIAGNOSIS — R6889 Other general symptoms and signs: Secondary | ICD-10-CM | POA: Diagnosis not present

## 2015-11-15 NOTE — Patient Instructions (Addendum)
Your flu swab was negative however I suspect you may have had a mild case of it or a viral infection of some sort. Stay well hydrated, treat your symptoms with tylenol or Ibuprofen.  Let me know if you're not improving in the next 2-3 days.

## 2015-11-15 NOTE — Progress Notes (Signed)
Subjective:  Trevor Johnson is a 55 y.o. male who presents for a 5 day history of flu-like symptoms.  Symptoms include fever, chills, body aches.  Denies ear pain, sore throat, drainage. Had flu shot. He is not a smoker. Denies history of pneumonia or bronchitis.   Treatment to date: none.  Denies sick contacts.  No other aggravating or relieving factors.  No other c/o.  ROS as in subjective.   Objective: Filed Vitals:   11/15/15 1017  BP: 138/82  Pulse: 76  Temp: 100 F (37.8 C)    General appearance: Alert, WD/WN, no distress, is not ill appearing                             Skin: warm, no rash                           Head: no sinus tenderness                            Eyes: conjunctiva normal, corneas clear, PERRLA                            Ears: pearly TMs, external ear canals normal                          Nose: septum midline, turbinates swollen, with erythema and clear discharge             Mouth/throat: MMM, tongue normal, mild pharyngeal erythema                           Neck: supple, no adenopathy, no thyromegaly, nontender                          Heart: RRR, normal S1, S2, no murmurs                         Lungs: CTA bilaterally, no wheezes, rales, or rhonchi     Assessment: Flu-like symptoms - Plan: POC Influenza A&B  Flu swab negative.  Plan: Discussed diagnosis and treatment of viral syndrome.  Suggested symptomatic OTC remedies. Mucinex DM for cough. Recommend he stay well-hydrated and make sure his urine is light yellow. Nasal saline spray for congestion.  Tylenol or Ibuprofen OTC for fever and malaise.  Call/return in 2-3 days if symptoms aren't resolving.

## 2016-01-19 ENCOUNTER — Ambulatory Visit (INDEPENDENT_AMBULATORY_CARE_PROVIDER_SITE_OTHER): Payer: BLUE CROSS/BLUE SHIELD | Admitting: Family Medicine

## 2016-01-19 ENCOUNTER — Encounter: Payer: Self-pay | Admitting: Family Medicine

## 2016-01-19 VITALS — BP 126/80 | HR 80 | Ht 72.0 in | Wt 281.0 lb

## 2016-01-19 DIAGNOSIS — E1169 Type 2 diabetes mellitus with other specified complication: Secondary | ICD-10-CM

## 2016-01-19 DIAGNOSIS — E1159 Type 2 diabetes mellitus with other circulatory complications: Secondary | ICD-10-CM

## 2016-01-19 DIAGNOSIS — E669 Obesity, unspecified: Secondary | ICD-10-CM | POA: Diagnosis not present

## 2016-01-19 DIAGNOSIS — I10 Essential (primary) hypertension: Secondary | ICD-10-CM

## 2016-01-19 DIAGNOSIS — E119 Type 2 diabetes mellitus without complications: Secondary | ICD-10-CM | POA: Diagnosis not present

## 2016-01-19 DIAGNOSIS — Z889 Allergy status to unspecified drugs, medicaments and biological substances status: Secondary | ICD-10-CM | POA: Diagnosis not present

## 2016-01-19 DIAGNOSIS — E785 Hyperlipidemia, unspecified: Secondary | ICD-10-CM | POA: Diagnosis not present

## 2016-01-19 DIAGNOSIS — Z1159 Encounter for screening for other viral diseases: Secondary | ICD-10-CM

## 2016-01-19 DIAGNOSIS — I152 Hypertension secondary to endocrine disorders: Secondary | ICD-10-CM

## 2016-01-19 DIAGNOSIS — Z789 Other specified health status: Secondary | ICD-10-CM

## 2016-01-19 LAB — LIPID PANEL
Cholesterol: 212 mg/dL — ABNORMAL HIGH (ref 125–200)
HDL: 19 mg/dL — AB (ref >=40)
Total CHOL/HDL Ratio: 11.2 Ratio — ABNORMAL HIGH (ref <=5.0)
Triglycerides: 1222 mg/dL — ABNORMAL HIGH (ref <150)

## 2016-01-19 LAB — CBC WITH DIFFERENTIAL/PLATELET
Basophils Absolute: 46 cells/uL (ref 0–200)
Basophils Relative: 1 %
EOS ABS: 138 {cells}/uL (ref 15–500)
Eosinophils Relative: 3 %
HEMATOCRIT: 45 % (ref 38.5–50.0)
Hemoglobin: 15.6 g/dL (ref 13.2–17.1)
Lymphocytes Relative: 30 %
Lymphs Abs: 1380 cells/uL (ref 850–3900)
MCH: 32.8 pg (ref 27.0–33.0)
MCHC: 34.7 g/dL (ref 32.0–36.0)
MCV: 94.7 fL (ref 80.0–100.0)
MONO ABS: 368 {cells}/uL (ref 200–950)
MPV: 10.6 fL (ref 7.5–12.5)
Monocytes Relative: 8 %
NEUTROS PCT: 58 %
Neutro Abs: 2668 cells/uL (ref 1500–7800)
PLATELETS: 192 10*3/uL (ref 140–400)
RBC: 4.75 MIL/uL (ref 4.20–5.80)
RDW: 14.4 % (ref 11.0–15.0)
WBC: 4.6 10*3/uL (ref 4.0–10.5)

## 2016-01-19 LAB — POCT UA - MICROALBUMIN
Albumin/Creatinine Ratio, Urine, POC: <3.8
Creatinine, POC: 132.4 mg/dL

## 2016-01-19 LAB — COMPREHENSIVE METABOLIC PANEL
ALT: 57 U/L — ABNORMAL HIGH (ref 9–46)
AST: 40 U/L — ABNORMAL HIGH (ref 10–35)
Albumin: 3.9 g/dL (ref 3.6–5.1)
Alkaline Phosphatase: 77 U/L (ref 40–115)
BUN: 9 mg/dL (ref 7–25)
CALCIUM: 9.6 mg/dL (ref 8.6–10.3)
CO2: 20 mmol/L (ref 20–31)
Chloride: 100 mmol/L (ref 98–110)
Creat: 0.82 mg/dL (ref 0.70–1.33)
GLUCOSE: 157 mg/dL — AB (ref 65–99)
Potassium: 4.3 mmol/L (ref 3.5–5.3)
Sodium: 134 mmol/L — ABNORMAL LOW (ref 135–146)
Total Bilirubin: 0.5 mg/dL (ref 0.2–1.2)
Total Protein: 6.9 g/dL (ref 6.1–8.1)

## 2016-01-19 LAB — POCT GLYCOSYLATED HEMOGLOBIN (HGB A1C): Hemoglobin A1C: 6.1

## 2016-01-19 MED ORDER — LISINOPRIL-HYDROCHLOROTHIAZIDE 10-12.5 MG PO TABS: ORAL_TABLET | ORAL | Status: DC

## 2016-01-19 MED ORDER — METFORMIN HCL 500 MG PO TABS: ORAL_TABLET | ORAL | Status: DC

## 2016-01-19 NOTE — Progress Notes (Signed)
  Subjective:    Patient ID: Trevor Johnson, male    DOB: 21-Mar-1961, 55 y.o.   MRN: [id number]  Trevor Johnson is a 55 y.o. male who presents for follow-up of Type 2 diabetes mellitus.  Patient is not checking home blood sugars.   Home blood sugar records: none How often is blood sugars being checked: - Current symptoms/problems none Daily foot checks: yes  Any foot concerns: none. He does check his feet periodically. Last eye exam: 1 year ago He will set up another eye exam in the near future Exercise: none Does have a history of statin intolerance with Lipitor, Crestor and possibly pravastatin The following portions of the patient's history were reviewed and updated as appropriate: allergies, current medications, past medical history, past social history and problem list.  ROS as in subjective above.     Objective:    Physical Exam Alert and in no distress otherwise not examined.   Lab Review Diabetic Labs Latest Ref Rng 07/21/2015 03/10/2015 11/03/2014 05/01/2014 12/04/2013  HbA1c - 5.9 5.6 6.1 5.9 5.9  Chol 0 - 200 mg/dL - - - 207(H) -  HDL >39 mg/dL - - - 25(L) -  Calc LDL 0 - 99 mg/dL - - - NOT CALC -  Triglycerides <150 mg/dL - - - 1139(H) -  Creatinine 0.50 - 1.35 mg/dL - - - 0.82 -   BP/Weight 11/15/2015 09/21/2015 07/21/2015 [id number]  Systolic BP 0000000 [id number] 123456 123456 0000000  Diastolic BP 82 82 80 70 72  Wt. (Lbs) 278.4 281.6 277 276 279.4  BMI 37.75 38.18 37.56 37.42 37.89  Foot exam today is negative. Trevor Johnson  reports that he has never smoked. He has never used smokeless tobacco. He reports that he does not drink alcohol or use illicit drugs. 1C is 6.1    Assessment & Plan:    Hyperlipidemia associated with type 2 diabetes mellitus ([place]) - Plan: Lipid panel  Obesity (BMI 30-39.9) - Plan: CBC with Differential/Platelet, Comprehensive metabolic panel, Lipid panel  Statin intolerance  Type 2 diabetes mellitus without complication, without long-term current use of  insulin (HCC) - Plan: POCT glycosylated hemoglobin (Hb A1C), POCT UA - Microalbumin, metFORMIN (GLUCOPHAGE) 500 MG tablet, CBC with Differential/Platelet, Comprehensive metabolic panel, Lipid panel  Hypertension associated with diabetes ([place]) - Plan: lisinopril-hydrochlorothiazide (PRINZIDE,ZESTORETIC) 10-12.5 MG tablet  Need for hepatitis C screening test - Plan: Hepatitis C antibody   1. Rx changes: a sample of Livalo was given. 2. Education: Reviewed 'ABCs' of diabetes management (respective goals in parentheses):  A1C (<7), blood pressure (<130/80), and cholesterol (LDL <100). 3. Compliance at present is estimated to be fair. Efforts to improve compliance (if necessary) will be directed at increased exercise. 4. Follow up: 4 months  5. I stressed the need for him to check his blood sugars periodically and adjust his diet and exercise accordingly. Discussed the fact that weight loss could be very beneficial to his overall health over the next several years. He was also given a sample of Livalo And he will let me know how he tolerates that.

## 2016-01-20 LAB — HEPATITIS C ANTIBODY: HCV Ab: NEGATIVE

## 2016-01-25 ENCOUNTER — Ambulatory Visit (INDEPENDENT_AMBULATORY_CARE_PROVIDER_SITE_OTHER): Payer: BLUE CROSS/BLUE SHIELD | Admitting: Family Medicine

## 2016-01-25 VITALS — BP 120/70 | HR 87 | Ht 72.0 in | Wt 281.0 lb

## 2016-01-25 DIAGNOSIS — E1169 Type 2 diabetes mellitus with other specified complication: Secondary | ICD-10-CM | POA: Diagnosis not present

## 2016-01-25 DIAGNOSIS — E785 Hyperlipidemia, unspecified: Secondary | ICD-10-CM | POA: Diagnosis not present

## 2016-01-25 MED ORDER — FENOFIBRATE 145 MG PO TABS: 145.0000 mg | ORAL_TABLET | Freq: Every day | ORAL | Status: DC

## 2016-01-25 NOTE — Progress Notes (Signed)
   Subjective:    Patient ID: Trevor Johnson, male    DOB: 1961-09-06, 55 y.o.   MRN: [id number]  HPI He is here for a consult concerning recent labs. It did show elevated cholesterol as well as greatly elevated triglycerides. He has had difficulty in the past with statins causing itching. I did give him Livalo unfortunately he has not tried it yet. He states that he usually takes a month before the itching starts.   Review of Systems     Objective:   Physical Exam Alert and in no distress otherwise not examined       Assessment & Plan:  Hyperlipidemia associated with type 2 diabetes mellitus ([place]) - Plan: fenofibrate (TRICOR) 145 MG tablet Start him on fenofibrate as well as give him a month's supply of Livalo. he is to call me in 1 month to let me know how he is doing

## 2016-03-31 ENCOUNTER — Other Ambulatory Visit: Payer: Self-pay | Admitting: Family Medicine

## 2016-05-23 ENCOUNTER — Ambulatory Visit: Payer: BLUE CROSS/BLUE SHIELD | Admitting: Family Medicine

## 2016-05-23 ENCOUNTER — Ambulatory Visit (INDEPENDENT_AMBULATORY_CARE_PROVIDER_SITE_OTHER): Payer: BLUE CROSS/BLUE SHIELD | Admitting: Family Medicine

## 2016-05-23 ENCOUNTER — Encounter: Payer: Self-pay | Admitting: Family Medicine

## 2016-05-23 VITALS — BP 140/80 | HR 101 | Ht 72.0 in | Wt 274.0 lb

## 2016-05-23 DIAGNOSIS — Z23 Encounter for immunization: Secondary | ICD-10-CM | POA: Diagnosis not present

## 2016-05-23 DIAGNOSIS — E1159 Type 2 diabetes mellitus with other circulatory complications: Secondary | ICD-10-CM

## 2016-05-23 DIAGNOSIS — I10 Essential (primary) hypertension: Secondary | ICD-10-CM

## 2016-05-23 DIAGNOSIS — E119 Type 2 diabetes mellitus without complications: Secondary | ICD-10-CM

## 2016-05-23 DIAGNOSIS — E669 Obesity, unspecified: Secondary | ICD-10-CM | POA: Diagnosis not present

## 2016-05-23 DIAGNOSIS — E785 Hyperlipidemia, unspecified: Secondary | ICD-10-CM | POA: Diagnosis not present

## 2016-05-23 DIAGNOSIS — I152 Hypertension secondary to endocrine disorders: Secondary | ICD-10-CM

## 2016-05-23 DIAGNOSIS — E1169 Type 2 diabetes mellitus with other specified complication: Secondary | ICD-10-CM

## 2016-05-23 LAB — POCT GLYCOSYLATED HEMOGLOBIN (HGB A1C): Hemoglobin A1C: 6.6

## 2016-05-23 IMAGING — CR DG CHEST 2V
2 series · 2 of 2 positions shown · non-contrast
Comparison: None.

CLINICAL DATA: Initial evaluation for cough for 1 week, left side
rib pain for 4 days

EXAM:
CHEST  2 VIEW

[w chest pa]
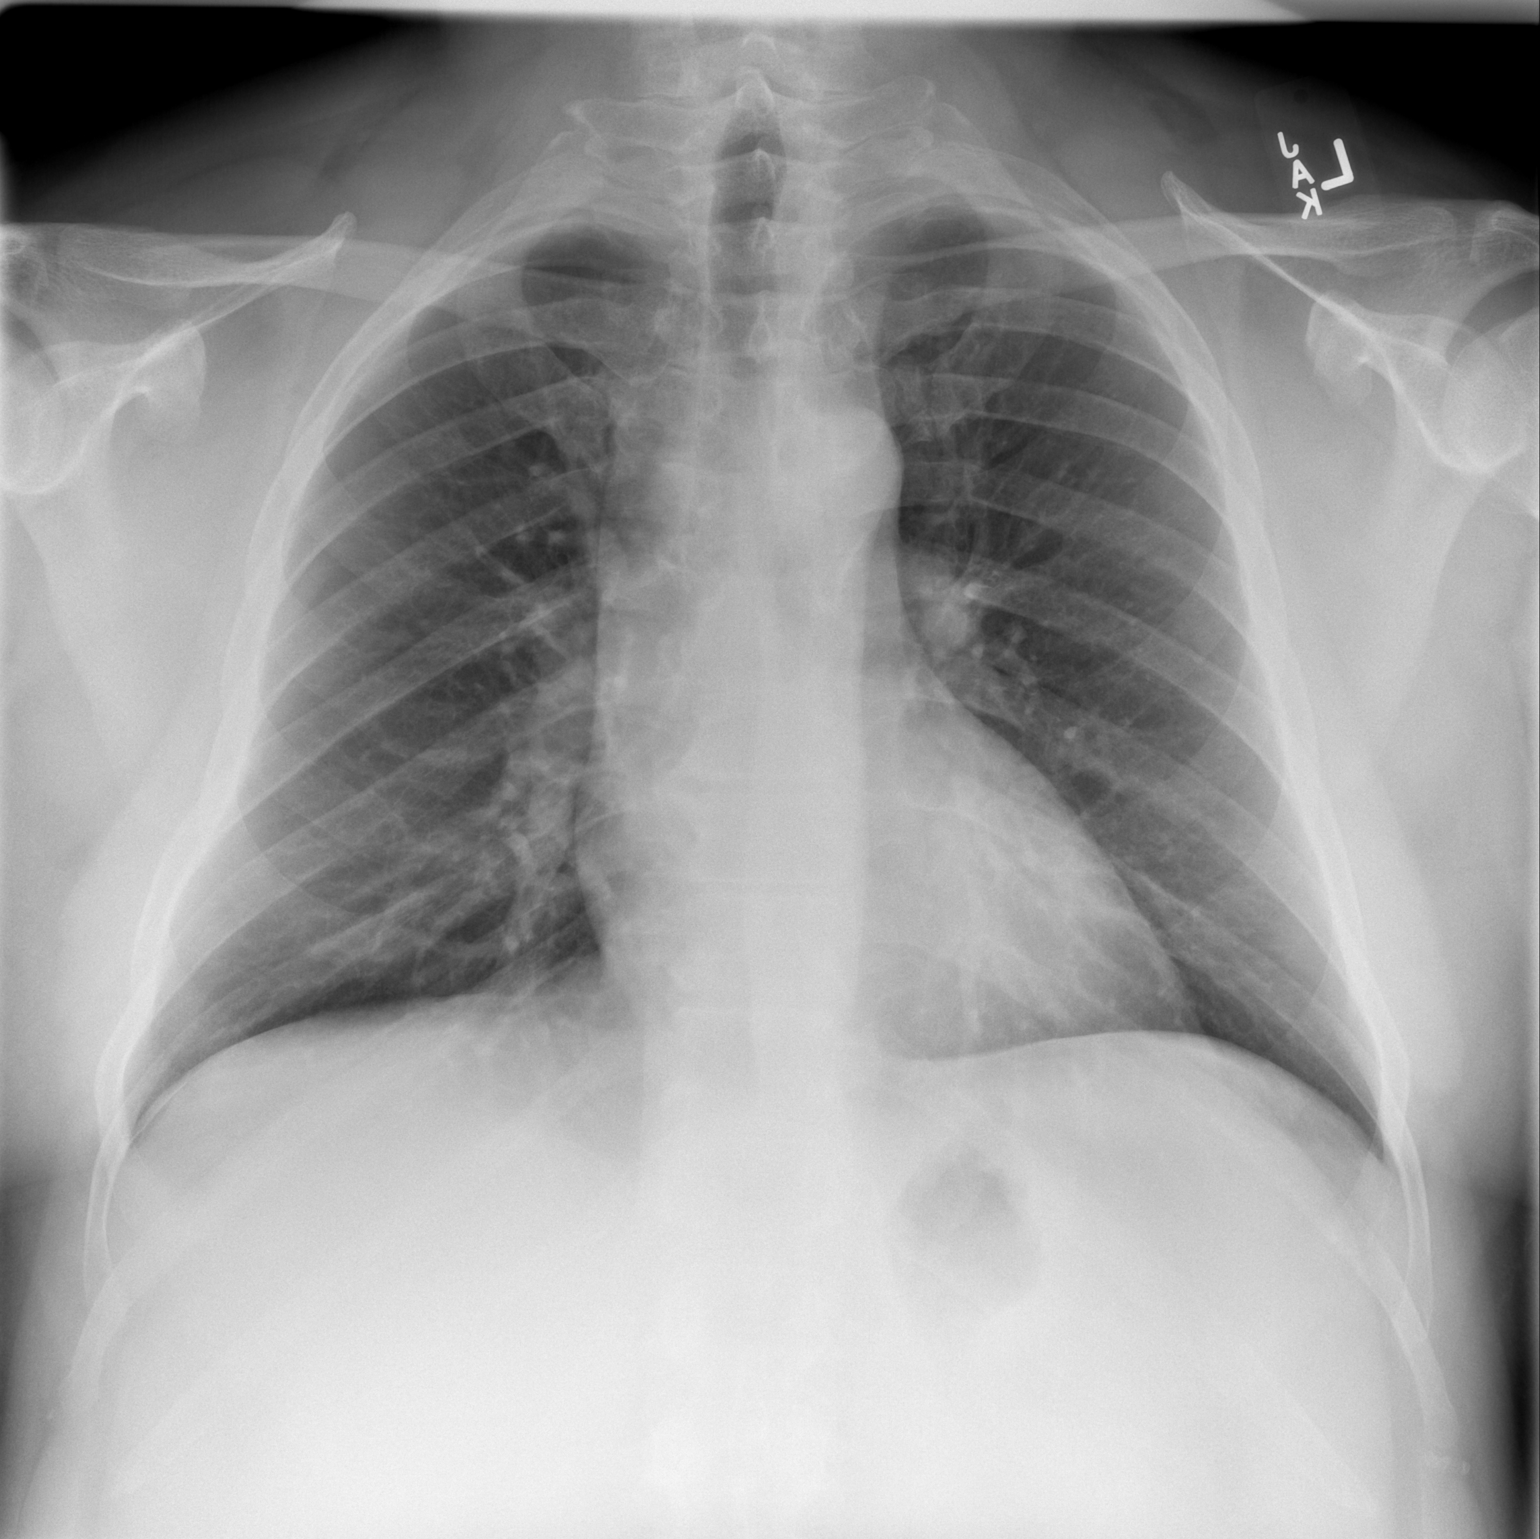

[w chest lat]
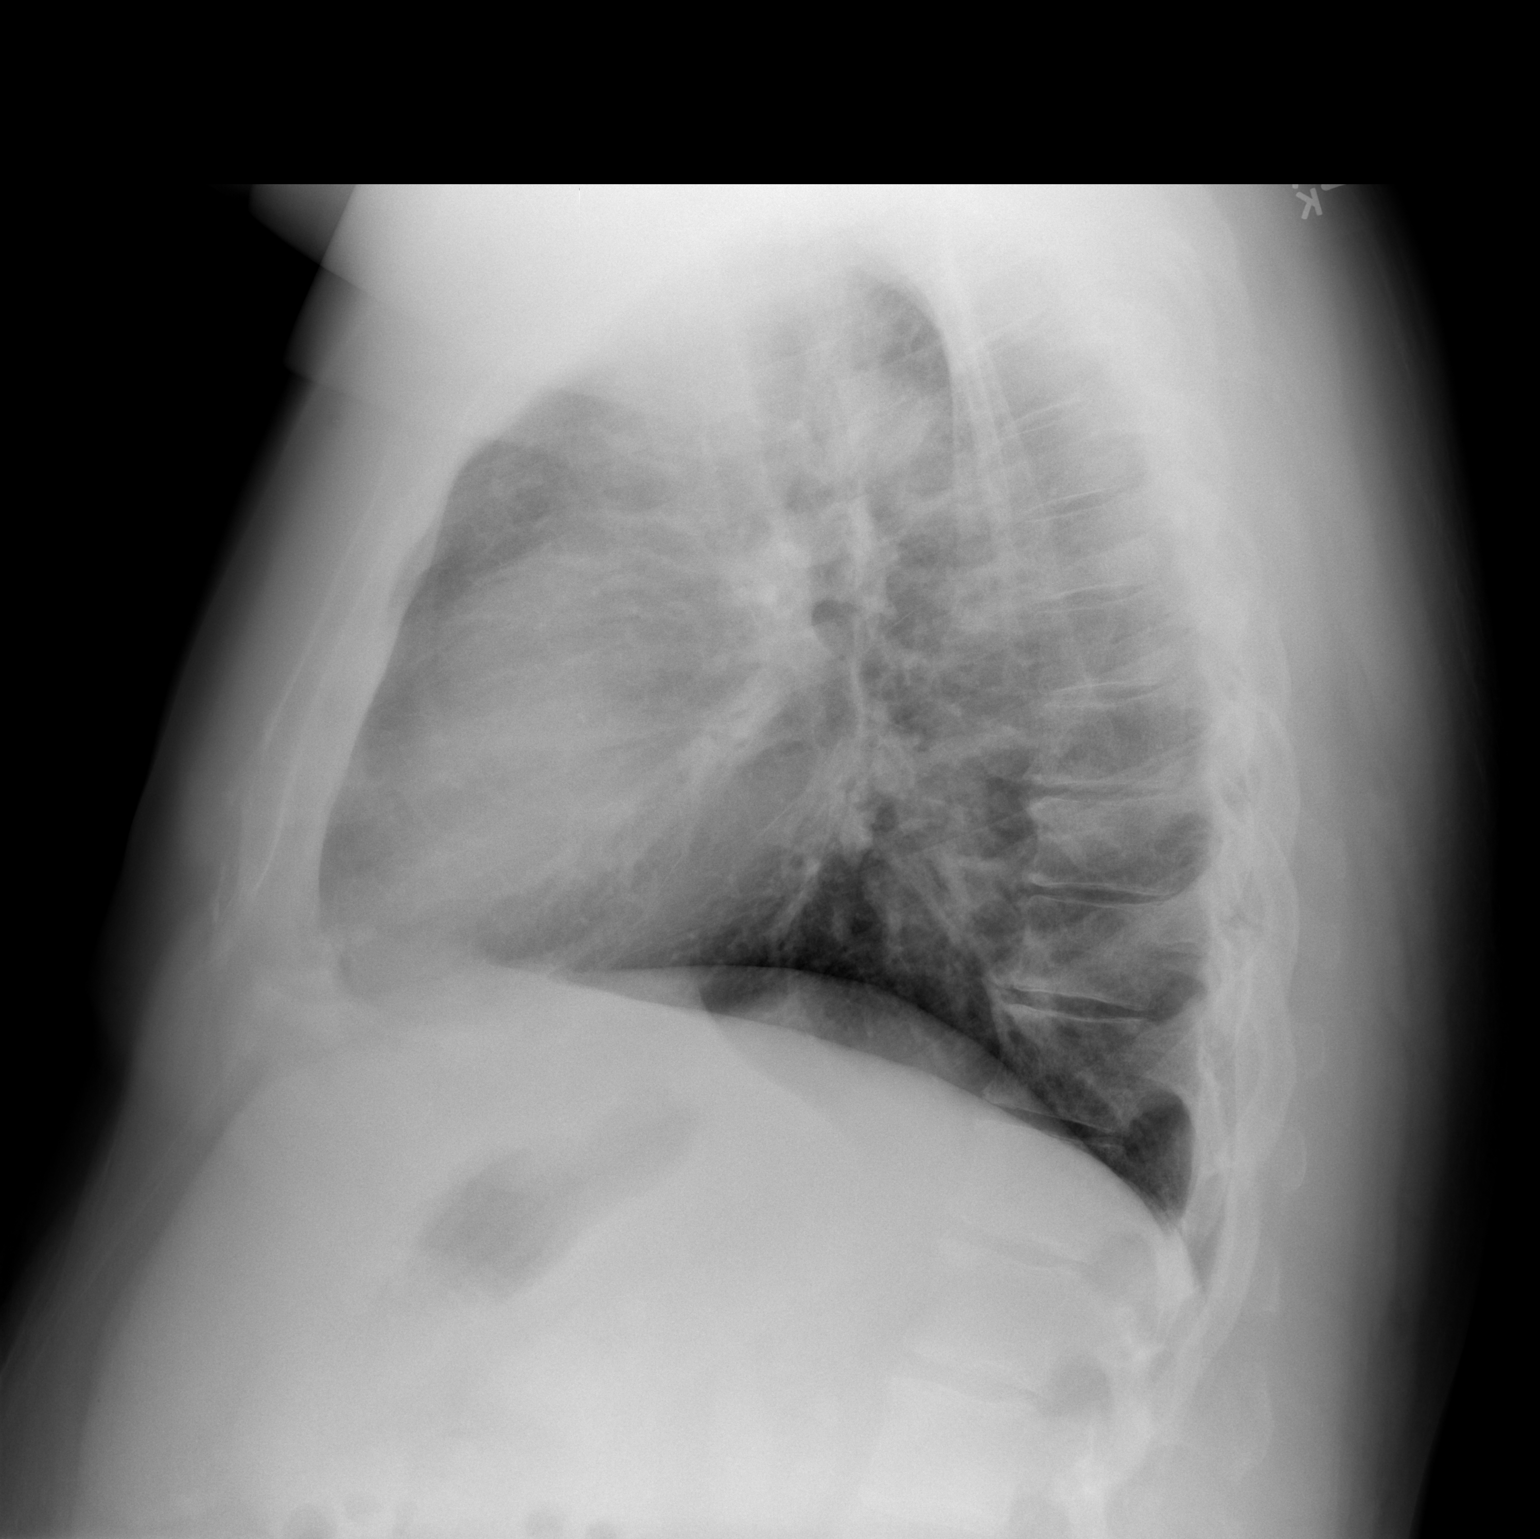

[2 of 2 positions shown; findings below may reference images not displayed]

FINDINGS: The heart size and vascular pattern are normal. The lungs are clear.
There are no pleural effusions. Bony thorax appears intact.
IMPRESSION: No active cardiopulmonary disease.

## 2016-05-23 NOTE — Progress Notes (Signed)
  Subjective:    Patient ID: Trevor Johnson, male    DOB: 1961/03/30, 55 y.o.   MRN: [id number]  Trevor Johnson is a 55 y.o. male who presents for follow-up of Type 2 diabetes mellitus.  Patient not checking home blood sugars.   Home blood sugar records: NA How often is blood sugars being checked: NA Current symptoms/problems None Daily foot checks: yes   Any foot concerns: none Last eye exam: pt wont go dont have time Exercise: walking He continues on metformin as well as TriCor, Lipitor. He has lost several pounds since his last visit. The following portions of the patient's history were reviewed and updated as appropriate: allergies, current medications, past medical history, past social history and problem list.  ROS as in subjective above.     Objective:    Physical Exam Alert and in no distress otherwise not examined.   Lab Review Diabetic Labs Latest Ref Rng & Units 01/19/2016 07/21/2015 03/10/2015 11/03/2014 05/01/2014  HbA1c - 6.1 5.9 5.6 6.1 5.9  Chol 125 - 200 mg/dL 212(H) - - - 207(H)  HDL >=40 mg/dL 19(L) - - - 25(L)  Calc LDL <130 mg/dL NOT CALC - - - NOT CALC  Triglycerides <150 mg/dL 1,222(H) - - - 1,139(H)  Creatinine 0.70 - 1.33 mg/dL 0.82 - - - 0.82   BP/Weight 01/25/2016 01/19/2016 11/15/2015 09/21/2015 [id number]  Systolic BP 123456 [id number] 0000000 A999333 123456  Diastolic BP 70 80 82 82 80  Wt. (Lbs) 281 281 278.4 281.6 277  BMI 38.1 38.1 37.75 38.18 37.56   Foot/eye exam completion dates 01/19/2016  Foot Form Completion Done  Hemoglobin A1c is 6.6  [name]  reports that he has never smoked. He has never used smokeless tobacco. He reports that he does not drink alcohol or use drugs.     Assessment & Plan:    Need for prophylactic vaccination and inoculation against influenza - Plan: Flu Vaccine QUAD 36+ mos PF IM (Fluarix & Fluzone Quad PF)  Diabetes mellitus without complication ([place]) - Plan: HgB A1c  Hyperlipidemia associated with type 2 diabetes mellitus  (HCC)  Obesity (BMI 30-39.9)  Hypertension associated with diabetes ([place])   1. Rx changes: none 2. Education: Reviewed 'ABCs' of diabetes management (respective goals in parentheses):  A1C (<7), blood pressure (<130/80), and cholesterol (LDL <100). 3. Compliance at present is estimated to be fair. Efforts to improve compliance will be directed at increased exercise, dietary modification especially in regard to lowering carbohydrates. Also strongly encouraged him to intermittently check his blood sugars since his A1c reading is slowly going up. Discussed options with him concerning this and he will make a concerted effort to make further changes however if no success, I will increase his metformin at the very least 4. Follow up: 4 months

## 2017-01-06 ENCOUNTER — Other Ambulatory Visit: Payer: Self-pay | Admitting: Family Medicine

## 2017-01-06 DIAGNOSIS — E119 Type 2 diabetes mellitus without complications: Secondary | ICD-10-CM

## 2017-01-18 ENCOUNTER — Ambulatory Visit (INDEPENDENT_AMBULATORY_CARE_PROVIDER_SITE_OTHER): Payer: BLUE CROSS/BLUE SHIELD | Admitting: Family Medicine

## 2017-01-18 ENCOUNTER — Encounter: Payer: Self-pay | Admitting: Family Medicine

## 2017-01-18 VITALS — BP 120/82 | HR 80 | Temp 98.3°F | Wt 269.4 lb

## 2017-01-18 DIAGNOSIS — H6692 Otitis media, unspecified, left ear: Secondary | ICD-10-CM | POA: Diagnosis not present

## 2017-01-18 MED ORDER — AMOXICILLIN 875 MG PO TABS: 875.0000 mg | ORAL_TABLET | Freq: Two times a day (BID) | ORAL | 0 refills | Status: DC

## 2017-01-18 NOTE — Progress Notes (Signed)
Subjective:  Chief Complaint  Patient presents with  . ear ache    ear ache, started saturday. dog had ear infection so he used his dogs ear drops- seem to help. ear is sore      Trevor Johnson is a 56 y.o. male who presents with ear pain and possible ear infection. Symptoms include: left ear pain. Onset of symptoms was 6 days ago, and have been gradually worsening since that time. Associated symptoms include: scratchy throat.  Patient denies: chills, fever , sinus pressure, sneezing and cough, abdominal pain, N/V/D. He is drinking plenty of fluids.  Treatment to date: used his dogs ear drops.  Denies sick contacts.  No other aggravating or relieving factors.  No other c/o.  ROS as in subjective  Objective:  Vitals:   01/18/17 1334  BP: 120/82  Pulse: 80  Temp: 98.3 F (36.8 C)    General appearance: Alert, WD/WN, no distress, mildly ill appearing                             Skin: warm, no rash                           Head: no sinus tenderness                            Eyes: conjunctiva normal, corneas clear, PERRLA                            Ears: left TM dull, red and slightly retracted, right TM normal, external ear canals normal                          Nose: septum midline, turbinates swollen, with erythema and clear discharge             Mouth/throat: MMM, tongue normal, mild pharyngeal erythema                           Neck: supple, no adenopathy, no thyromegaly, nontender                          Heart: RRR, normal S1, S2, no murmurs                         Lungs: CTA bilaterally, no wheezes, rales, or rhonchi      Assessment: Acute otitis media, left    Plan: Prescription sent for amoxicillin.   Discussed diagnosis and treatment of otitis media.  Suggested symptomatic OTC remedies.  Tylenol or Ibuprofen OTC for fever and malaise.  Call/return if not back to baseline or if symptoms worsen.

## 2017-03-01 ENCOUNTER — Other Ambulatory Visit: Payer: Self-pay | Admitting: Family Medicine

## 2017-03-01 DIAGNOSIS — E1159 Type 2 diabetes mellitus with other circulatory complications: Secondary | ICD-10-CM

## 2017-03-01 DIAGNOSIS — I10 Essential (primary) hypertension: Principal | ICD-10-CM

## 2017-03-01 DIAGNOSIS — I152 Hypertension secondary to endocrine disorders: Secondary | ICD-10-CM

## 2017-05-15 ENCOUNTER — Other Ambulatory Visit: Payer: Self-pay | Admitting: Family Medicine

## 2017-05-15 DIAGNOSIS — E119 Type 2 diabetes mellitus without complications: Secondary | ICD-10-CM

## 2017-05-22 ENCOUNTER — Encounter: Payer: Self-pay | Admitting: Family Medicine

## 2017-05-22 ENCOUNTER — Ambulatory Visit (INDEPENDENT_AMBULATORY_CARE_PROVIDER_SITE_OTHER): Payer: BLUE CROSS/BLUE SHIELD | Admitting: Family Medicine

## 2017-05-22 VITALS — BP 122/72 | HR 104 | Resp 16 | Wt 273.8 lb

## 2017-05-22 DIAGNOSIS — E1169 Type 2 diabetes mellitus with other specified complication: Secondary | ICD-10-CM | POA: Diagnosis not present

## 2017-05-22 DIAGNOSIS — E119 Type 2 diabetes mellitus without complications: Secondary | ICD-10-CM

## 2017-05-22 DIAGNOSIS — E785 Hyperlipidemia, unspecified: Secondary | ICD-10-CM | POA: Diagnosis not present

## 2017-05-22 DIAGNOSIS — Z789 Other specified health status: Secondary | ICD-10-CM | POA: Diagnosis not present

## 2017-05-22 DIAGNOSIS — I10 Essential (primary) hypertension: Secondary | ICD-10-CM | POA: Diagnosis not present

## 2017-05-22 DIAGNOSIS — E669 Obesity, unspecified: Secondary | ICD-10-CM

## 2017-05-22 DIAGNOSIS — E1159 Type 2 diabetes mellitus with other circulatory complications: Secondary | ICD-10-CM

## 2017-05-22 DIAGNOSIS — Z23 Encounter for immunization: Secondary | ICD-10-CM

## 2017-05-22 DIAGNOSIS — T466X5A Adverse effect of antihyperlipidemic and antiarteriosclerotic drugs, initial encounter: Secondary | ICD-10-CM | POA: Insufficient documentation

## 2017-05-22 DIAGNOSIS — I152 Hypertension secondary to endocrine disorders: Secondary | ICD-10-CM

## 2017-05-22 LAB — CBC WITH DIFFERENTIAL/PLATELET
Basophils Absolute: 63 cells/uL (ref 0–200)
Basophils Relative: 1 %
Eosinophils Absolute: 63 cells/uL (ref 15–500)
Eosinophils Relative: 1 %
HCT: 42.9 % (ref 38.5–50.0)
Hemoglobin: 15.1 g/dL (ref 13.2–17.1)
LYMPHS PCT: 31 %
Lymphs Abs: 1953 cells/uL (ref 850–3900)
MCH: 33.8 pg — AB (ref 27.0–33.0)
MCHC: 35.2 g/dL (ref 32.0–36.0)
MCV: 96 fL (ref 80.0–100.0)
MONOS PCT: 8 %
MPV: 10.5 fL (ref 7.5–12.5)
Monocytes Absolute: 504 cells/uL (ref 200–950)
NEUTROS PCT: 59 %
Neutro Abs: 3717 cells/uL (ref 1500–7800)
Platelets: 180 10*3/uL (ref 140–400)
RBC: 4.47 MIL/uL (ref 4.20–5.80)
RDW: 13.4 % (ref 11.0–15.0)
WBC: 6.3 10*3/uL (ref 4.0–10.5)

## 2017-05-22 LAB — POCT UA - MICROALBUMIN
ALBUMIN/CREATININE RATIO, URINE, POC: 7.3
CREATININE, POC: 145.5 mg/dL
Microalbumin Ur, POC: 10.6 mg/L

## 2017-05-22 LAB — POCT GLYCOSYLATED HEMOGLOBIN (HGB A1C): HEMOGLOBIN A1C: 7.5

## 2017-05-22 MED ORDER — LISINOPRIL-HYDROCHLOROTHIAZIDE 10-12.5 MG PO TABS: ORAL_TABLET | ORAL | 0 refills | Status: DC

## 2017-05-22 MED ORDER — METFORMIN HCL ER 750 MG PO TB24: ORAL_TABLET | ORAL | 5 refills | Status: DC

## 2017-05-22 NOTE — Progress Notes (Signed)
   Subjective:    Patient ID: Trevor Johnson, male    DOB: April 05, 1961, 56 y.o.   MRN: [id number]  HPI He is here for follow-up on his diabetes. He has not had a checkup on this in approximately one year. He notes that the metformin causes difficulty with diarrhea. He is now taking it once a day. He states that he is quite active and has made some changes in his eating habits. He continues on lisinopril and has statin intolerance. He has tried 3 different statin drugs without success. He does not smoke or drink. His work is keeping him quite busy. Family and social history was also reviewed.   Review of Systems     Objective:   Physical Exam  Alert and in no distress. Foot exam is recorded and is normal. Hemoglobin A1c is 7.5.       Assessment & Plan:  Diabetes mellitus without complication ([place]) - Plan: metFORMIN (GLUCOPHAGE XR) 750 MG 24 hr tablet, CBC with Differential/Platelet, Comprehensive metabolic panel  Hyperlipidemia associated with type 2 diabetes mellitus ([place]) - Plan: HgB A1c, POCT UA - Microalbumin, Lipid panel  Statin intolerance  Obesity (BMI 30-39.9) - Plan: CBC with Differential/Platelet, Comprehensive metabolic panel, Lipid panel  Hypertension associated with diabetes ([place]) - Plan: lisinopril-hydrochlorothiazide (PRINZIDE,ZESTORETIC) 10-12.5 MG tablet  Need for influenza vaccination - Plan: Flu Vaccine QUAD 6+ mos PF IM (Fluarix Quad PF) I discussed the metformin with him and will place him on a longer acting variety. If the cost is prohibitive, he will let me go. Explained that I have multiple other medications that can be given if needed. He is to keep me informed on this. Also discussed 20 minutes of something physical on a daily basis. He is now starting to check his total number of steps per day. Discussed cutting back on carbohydrates. I have discussed this with him in the past without much success. Otherwise I will see him in roughly 4 months.

## 2017-05-23 LAB — COMPREHENSIVE METABOLIC PANEL
ALT: 47 U/L — ABNORMAL HIGH (ref 9–46)
AST: 36 U/L — ABNORMAL HIGH (ref 10–35)
Albumin: 4.2 g/dL (ref 3.6–5.1)
Alkaline Phosphatase: 84 U/L (ref 40–115)
BUN: 12 mg/dL (ref 7–25)
CHLORIDE: 98 mmol/L (ref 98–110)
CO2: 21 mmol/L (ref 20–32)
Calcium: 9 mg/dL (ref 8.6–10.3)
Creat: 1.01 mg/dL (ref 0.70–1.33)
Glucose, Bld: 232 mg/dL — ABNORMAL HIGH (ref 65–99)
Potassium: 4 mmol/L (ref 3.5–5.3)
Sodium: 134 mmol/L — ABNORMAL LOW (ref 135–146)
Total Bilirubin: 0.7 mg/dL (ref 0.2–1.2)
Total Protein: 6.9 g/dL (ref 6.1–8.1)

## 2017-05-23 LAB — LIPID PANEL
Cholesterol: 232 mg/dL — ABNORMAL HIGH (ref <200)
HDL: 22 mg/dL — ABNORMAL LOW (ref >40)
TRIGLYCERIDES: 1214 mg/dL — AB (ref <150)
Total CHOL/HDL Ratio: 10.5 Ratio — ABNORMAL HIGH (ref <5.0)

## 2017-06-17 ENCOUNTER — Encounter: Payer: Self-pay | Admitting: Family Medicine

## 2017-09-17 ENCOUNTER — Other Ambulatory Visit: Payer: Self-pay | Admitting: Family Medicine

## 2017-09-17 DIAGNOSIS — I10 Essential (primary) hypertension: Principal | ICD-10-CM

## 2017-09-17 DIAGNOSIS — I152 Hypertension secondary to endocrine disorders: Secondary | ICD-10-CM

## 2017-09-17 DIAGNOSIS — E1159 Type 2 diabetes mellitus with other circulatory complications: Secondary | ICD-10-CM

## 2017-09-21 ENCOUNTER — Ambulatory Visit: Payer: BLUE CROSS/BLUE SHIELD | Admitting: Family Medicine

## 2017-09-28 ENCOUNTER — Other Ambulatory Visit: Payer: Self-pay

## 2017-09-28 ENCOUNTER — Ambulatory Visit: Payer: BLUE CROSS/BLUE SHIELD | Admitting: Family Medicine

## 2017-09-28 ENCOUNTER — Encounter: Payer: Self-pay | Admitting: Family Medicine

## 2017-09-28 ENCOUNTER — Telehealth: Payer: Self-pay | Admitting: Family Medicine

## 2017-09-28 VITALS — BP 124/84 | HR 85 | Ht 72.0 in | Wt 248.6 lb

## 2017-09-28 DIAGNOSIS — E119 Type 2 diabetes mellitus without complications: Secondary | ICD-10-CM | POA: Diagnosis not present

## 2017-09-28 DIAGNOSIS — E1169 Type 2 diabetes mellitus with other specified complication: Secondary | ICD-10-CM | POA: Diagnosis not present

## 2017-09-28 DIAGNOSIS — N529 Male erectile dysfunction, unspecified: Secondary | ICD-10-CM | POA: Diagnosis not present

## 2017-09-28 DIAGNOSIS — E1159 Type 2 diabetes mellitus with other circulatory complications: Secondary | ICD-10-CM | POA: Diagnosis not present

## 2017-09-28 DIAGNOSIS — E785 Hyperlipidemia, unspecified: Secondary | ICD-10-CM | POA: Diagnosis not present

## 2017-09-28 DIAGNOSIS — I152 Hypertension secondary to endocrine disorders: Secondary | ICD-10-CM

## 2017-09-28 DIAGNOSIS — Z789 Other specified health status: Secondary | ICD-10-CM

## 2017-09-28 DIAGNOSIS — I10 Essential (primary) hypertension: Secondary | ICD-10-CM

## 2017-09-28 LAB — HEMOGLOBIN A1C: Hemoglobin A1C: 9.7

## 2017-09-28 MED ORDER — EXENATIDE ER 2 MG/0.85ML ~~LOC~~ AUIJ: 2.0000 mg | AUTO-INJECTOR | SUBCUTANEOUS | 1 refills | Status: DC

## 2017-09-28 MED ORDER — BAYER MICROLET LANCETS MISC: 5 refills | Status: DC

## 2017-09-28 MED ORDER — GLUCOSE BLOOD VI STRP: ORAL_STRIP | 5 refills | Status: DC

## 2017-09-28 MED ORDER — SILDENAFIL CITRATE 20 MG PO TABS: ORAL_TABLET | ORAL | 0 refills | Status: DC

## 2017-09-28 MED ORDER — BAYER CONTOUR MONITOR DEVI: 0 refills | Status: DC

## 2017-09-28 NOTE — Progress Notes (Signed)
  Subjective:    Patient ID: Trevor Johnson, male    DOB: 04-Jul-1961, 57 y.o.   MRN: [id number]  Trevor Johnson is a 57 y.o. male who presents for follow-up of Type 2 diabetes mellitus.  Patient not checking home blood sugars.   Home blood sugar records: n/a unsure.  How often is blood sugars being checked: none Current symptoms/problems include continued difficulty with metformin induced diarrhea.  He has however been taking the medication. Daily foot checks: yes   Any foot concerns: none Last eye exam: not had one. Exercise: walk daily He has made some dietary changes and has lost a significant amount of weight. He continues on lisinopril/HCTZ as well as Statistician.  He does have a statin intolerance.  He would like a refill on his Viagra. The following portions of the patient's history were reviewed and updated as appropriate: allergies, current medications, past medical history, past social history and problem list.  ROS as in subjective above.     Objective:    Physical Exam Alert and in no distress otherwise not examined.   Lab Review Diabetic Labs Latest Ref Rng & Units 05/22/2017 05/23/2016 01/19/2016 07/21/2015 03/10/2015  HbA1c - 7.5 6.6 6.1 5.9 5.6  Microalbumin mg/L 10.6 - <5.0 - -  Micro/Creat Ratio - 7.3 - <3.8 - -  Chol <200 mg/dL 232(H) - 212(H) - -  HDL >40 mg/dL 22(L) - 19(L) - -  Calc LDL <100 mg/dL NOT CALC - NOT CALC - -  Triglycerides <150 mg/dL 1,214(H) - 1,222(H) - -  Creatinine 0.70 - 1.33 mg/dL 1.01 - 0.82 - -   BP/Weight 05/22/2017 01/18/2017 05/23/2016 01/25/2016 1/54/0086  Systolic BP 761 950 932 671 245  Diastolic BP 72 82 80 70 80  Wt. (Lbs) 273.8 269.4 274 281 281  BMI 37.13 36.54 37.16 38.1 38.1   Foot/eye exam completion dates 05/22/2017 01/19/2016  Foot Form Completion Done Done  A1C  9.7  [name]  reports that  has never smoked. he has never used smokeless tobacco. He reports that he does not drink alcohol or use drugs.     Assessment & Plan:   Hyperlipidemia associated with type 2 diabetes mellitus (HCC)  Statin intolerance  Diabetes mellitus without complication ([place])  Hypertension associated with diabetes ([place])  Erectile dysfunction, unspecified erectile dysfunction type - Plan: sildenafil (REVATIO) 20 MG tablet 1.  2. Rx changes: He is to stop his metformin.  He will be placed on Trulicity.  The first injection was given here. 3. Education: Reviewed 'ABCs' of diabetes management (respective goals in parentheses):  A1C (<7), blood pressure (<130/80), and cholesterol (LDL <100). 4. Compliance at present is estimated to be excellent. Efforts to improve compliance (if necessary) will be directed at Continue with present diet and exercise regimen will start checking your blood sugars either before or after a meal.. 5. Follow up: 4 months I will also give him sildenafil.  Recommend he try minimum effective dose for that. Unfortunately in spite of his weight loss, his blood sugars have gone up.  Did discuss various options with him and he decided to start the weekly injection as opposed to taking the pill every day. Also a new glucometer was called in as his last one was broken. Trulicity is not covered and I will therefore switch him to [employer]

## 2017-09-28 NOTE — Telephone Encounter (Signed)
Pt was called to informed him that the trulicity isn't covered by his insurance and byduron will be called into his pharmacy.

## 2017-11-02 ENCOUNTER — Encounter: Payer: Self-pay | Admitting: Family Medicine

## 2017-11-02 ENCOUNTER — Ambulatory Visit: Payer: BLUE CROSS/BLUE SHIELD | Admitting: Family Medicine

## 2017-11-02 VITALS — BP 118/80 | HR 87 | Wt 241.0 lb

## 2017-11-02 DIAGNOSIS — R3589 Other polyuria: Secondary | ICD-10-CM

## 2017-11-02 DIAGNOSIS — E119 Type 2 diabetes mellitus without complications: Secondary | ICD-10-CM | POA: Diagnosis not present

## 2017-11-02 DIAGNOSIS — R358 Other polyuria: Secondary | ICD-10-CM

## 2017-11-02 DIAGNOSIS — R631 Polydipsia: Secondary | ICD-10-CM

## 2017-11-02 MED ORDER — INSULIN GLARGINE 100 UNIT/ML SOLOSTAR PEN: 10.0000 [IU] | PEN_INJECTOR | Freq: Every day | SUBCUTANEOUS | 99 refills | Status: DC

## 2017-11-02 MED ORDER — EMPAGLIFLOZIN-LINAGLIPTIN 25-5 MG PO TABS: 1.0000 | ORAL_TABLET | Freq: Every day | ORAL | 5 refills | Status: DC

## 2017-11-02 NOTE — Progress Notes (Signed)
   Subjective:    Patient ID: Trevor Johnson, male    DOB: [birth year], 57 y.o.   MRN: [id number]  HPI He is here for consult concerning difficulty with his diabetes.  He was originally placed on Trulicity however his insurance would not cover it.  He was then switched to Bydureon and did note areas of swelling and discomfort where he gave the shots.  Also during that timeframe he noted increased difficulty with polyuria and polydipsia.   Review of Systems     Objective:   Physical Exam Alert and in no distress.  Hemoglobin A1c is 10.3       Assessment & Plan:  Diabetes mellitus without complication (HCC) - Plan: Empagliflozin-Linagliptin (GLYXAMBI) 25-5 MG TABS, Insulin Glargine (LANTUS SOLOSTAR) 100 UNIT/ML Solostar Pen  Polyuria - Plan: Empagliflozin-Linagliptin (GLYXAMBI) 25-5 MG TABS, Insulin Glargine (LANTUS SOLOSTAR) 100 UNIT/ML Solostar Pen  Polydipsia - Plan: Empagliflozin-Linagliptin (GLYXAMBI) 25-5 MG TABS, Insulin Glargine (LANTUS SOLOSTAR) 100 UNIT/ML Solostar Pen He is now symptomatic from his blood sugars getting under control and I will therefore add Lantus to his regimen and start him on Glyxambi.  The GLP does not seem to be working well for him.  Recheck here in 1 week.  He is to bring in his blood sugar readings.

## 2017-11-08 ENCOUNTER — Ambulatory Visit: Payer: BLUE CROSS/BLUE SHIELD | Admitting: Family Medicine

## 2017-11-08 ENCOUNTER — Encounter: Payer: Self-pay | Admitting: Family Medicine

## 2017-11-08 VITALS — BP 120/80 | HR 84 | Wt 241.2 lb

## 2017-11-08 DIAGNOSIS — E119 Type 2 diabetes mellitus without complications: Secondary | ICD-10-CM

## 2017-11-08 NOTE — Progress Notes (Signed)
   Subjective:    Patient ID: Trevor Johnson, male    DOB: [birth year], 57 y.o.   MRN: [id number]  HPI He is here for recheck on his diabetes.  He is now taking 10 units of Lantus daily and started feeling better 2 days after being placed on this.  He is also taking Glyxambi and is having no difficulty with that.  His most recent morning CBG was 187.   Review of Systems     Objective:   Physical Exam Alert and in no distress otherwise not examined       Assessment & Plan:  Diabetes mellitus without complication ([place]) Continue on present medication regimen.  Reinforced the need to check his blood sugar either before a meal or 2 hours after a meal.  If his blood sugar in the morning drops below 100 he will call me and I will probably stop the Lantus.  Recheck here in 2 weeks and hopefully at that point stop the Lantus.  He is comfortable with that approach.

## 2017-11-08 NOTE — Patient Instructions (Signed)
If your blood sugar in the morning goes below 100 call me

## 2017-11-22 ENCOUNTER — Ambulatory Visit: Payer: BLUE CROSS/BLUE SHIELD | Admitting: Family Medicine

## 2017-11-29 ENCOUNTER — Other Ambulatory Visit: Payer: Self-pay | Admitting: Family Medicine

## 2017-11-29 DIAGNOSIS — I10 Essential (primary) hypertension: Principal | ICD-10-CM

## 2017-11-29 DIAGNOSIS — E1159 Type 2 diabetes mellitus with other circulatory complications: Secondary | ICD-10-CM

## 2017-11-29 DIAGNOSIS — I152 Hypertension secondary to endocrine disorders: Secondary | ICD-10-CM

## 2017-12-17 ENCOUNTER — Ambulatory Visit: Payer: BLUE CROSS/BLUE SHIELD | Admitting: Family Medicine

## 2017-12-17 ENCOUNTER — Encounter: Payer: Self-pay | Admitting: Family Medicine

## 2017-12-17 VITALS — BP 124/82 | HR 75 | Wt 246.6 lb

## 2017-12-17 DIAGNOSIS — E118 Type 2 diabetes mellitus with unspecified complications: Secondary | ICD-10-CM | POA: Diagnosis not present

## 2017-12-17 DIAGNOSIS — E119 Type 2 diabetes mellitus without complications: Secondary | ICD-10-CM

## 2017-12-17 LAB — POCT GLYCOSYLATED HEMOGLOBIN (HGB A1C): HEMOGLOBIN A1C: 7.5

## 2017-12-17 NOTE — Progress Notes (Signed)
   Subjective:    Patient ID: Trevor Johnson, male    DOB: 05/24/1961, 57 y.o.   MRN: [id number]  HPI He is here for a recheck.  He stopped taking his Lantus approximately 1 month ago.  His blood sugars have remained below 120.  He had difficulty with he notes Bydureon causing unacceptable side effects and also metformin causing diarrhea.  He notes that his vision has improved since his blood sugars have come down.  Review of Systems     Objective:   Physical Exam Alert and in no distress otherwise not examined.  A1c is 9.7      Assessment & Plan:  Type 2 diabetes mellitus with complication, unspecified whether long term insulin use ([place]) - Plan: POCT glycosylated hemoglobin (Hb A1C)  Diabetes mellitus without complication ([place]) I complemented him on the work that he did.  He will continue on his present medication regimen.  Again encouraged him to make further diet and exercise changes.  Recheck here in several months.

## 2018-03-06 ENCOUNTER — Other Ambulatory Visit: Payer: Self-pay | Admitting: Family Medicine

## 2018-03-06 DIAGNOSIS — I152 Hypertension secondary to endocrine disorders: Secondary | ICD-10-CM

## 2018-03-06 DIAGNOSIS — E1159 Type 2 diabetes mellitus with other circulatory complications: Secondary | ICD-10-CM

## 2018-03-06 DIAGNOSIS — I10 Essential (primary) hypertension: Principal | ICD-10-CM

## 2018-04-19 ENCOUNTER — Encounter: Payer: Self-pay | Admitting: Family Medicine

## 2018-04-19 ENCOUNTER — Ambulatory Visit: Payer: BLUE CROSS/BLUE SHIELD | Admitting: Family Medicine

## 2018-04-19 VITALS — BP 120/84 | HR 65 | Temp 97.9°F | Ht 71.0 in | Wt 256.6 lb

## 2018-04-19 DIAGNOSIS — E118 Type 2 diabetes mellitus with unspecified complications: Secondary | ICD-10-CM | POA: Diagnosis not present

## 2018-04-19 DIAGNOSIS — Z789 Other specified health status: Secondary | ICD-10-CM

## 2018-04-19 DIAGNOSIS — I152 Hypertension secondary to endocrine disorders: Secondary | ICD-10-CM

## 2018-04-19 DIAGNOSIS — E1159 Type 2 diabetes mellitus with other circulatory complications: Secondary | ICD-10-CM | POA: Diagnosis not present

## 2018-04-19 DIAGNOSIS — E785 Hyperlipidemia, unspecified: Secondary | ICD-10-CM

## 2018-04-19 DIAGNOSIS — N529 Male erectile dysfunction, unspecified: Secondary | ICD-10-CM

## 2018-04-19 DIAGNOSIS — E1169 Type 2 diabetes mellitus with other specified complication: Secondary | ICD-10-CM | POA: Diagnosis not present

## 2018-04-19 DIAGNOSIS — I10 Essential (primary) hypertension: Secondary | ICD-10-CM

## 2018-04-19 LAB — POCT GLYCOSYLATED HEMOGLOBIN (HGB A1C): HEMOGLOBIN A1C: 5.5 % (ref 4.0–5.6)

## 2018-04-19 NOTE — Progress Notes (Signed)
  Subjective:    Patient ID: Trevor Johnson, male    DOB: Sep 13, 1961, 57 y.o.   MRN: [id number]  Trevor Johnson is a 57 y.o. male who presents for follow-up of Type 2 diabetes mellitus.  Patient is} checking home blood sugars.   Home blood sugar records: meter record How often is blood sugars being checked: qd  Current symptoms/problems include none and have been unchanged. Daily foot checks: yes   Any foot concerns: no Last eye exam: never Exercise: walk 30-40 min day He is now taking Glyxambi and doing well on this.  He does have a previous history of difficulty with metformin causing GI problems.  Trulicity was ineffective.  He has been on Glyxambi for the last several months.  He continues on lisinopril without difficulty.  He also is using sildenafil on an as-needed basis and getting good results with that. The following portions of the patient's history were reviewed and updated as appropriate: allergies, current medications, past medical history, past social history and problem list.  ROS as in subjective above.     Objective:    Physical Exam Alert and in no distress otherwise not examined.   Lab Review Diabetic Labs Latest Ref Rng & Units 12/17/2017 09/28/2017 05/22/2017 05/23/2016 01/19/2016  HbA1c - 7.5 9.7 7.5 6.6 6.1  Microalbumin mg/L - - 10.6 - <5.0  Micro/Creat Ratio - - - 7.3 - <3.8  Chol <200 mg/dL - - 232(H) - 212(H)  HDL >40 mg/dL - - 22(L) - 19(L)  Calc LDL <100 mg/dL - - NOT CALC - NOT CALC  Triglycerides <150 mg/dL - - 1,214(H) - 1,222(H)  Creatinine 0.70 - 1.33 mg/dL - - 1.01 - 0.82   BP/Weight 12/17/2017 11/08/2017 11/02/2017 09/28/2017 6/83/4196  Systolic BP 222 979 892 119 417  Diastolic BP 82 80 80 84 72  Wt. (Lbs) 246.6 241.2 241 248.6 273.8  BMI 33.44 32.71 32.69 33.72 37.13   Foot/eye exam completion dates 05/22/2017 01/19/2016  Foot Form Completion Done Done  A1c is 5.5  Trevor Johnson  reports that he has never smoked. He has never used smokeless tobacco. He  reports that he does not drink alcohol or use drugs.     Assessment & Plan:     1. Rx changes: none 2. Education: Reviewed 'ABCs' of diabetes management (respective goals in parentheses):  A1C (<7), blood pressure (<130/80), and cholesterol (LDL <100). 3. Compliance at present is estimated to be excellent. Efforts to improve compliance (if necessary) will be directed at No change. 4. Follow up: 4 months His A1c is excellent.  I did discuss the fact that we might possibly need to readjust this later down to 1 medication if we continue to notice a decrease in his A1c.  He did state that his weight went up because he thinks he is been working out more.

## 2018-05-06 ENCOUNTER — Other Ambulatory Visit: Payer: Self-pay | Admitting: Family Medicine

## 2018-05-06 ENCOUNTER — Telehealth: Payer: Self-pay | Admitting: Family Medicine

## 2018-05-06 DIAGNOSIS — R358 Other polyuria: Secondary | ICD-10-CM

## 2018-05-06 DIAGNOSIS — E119 Type 2 diabetes mellitus without complications: Secondary | ICD-10-CM

## 2018-05-06 DIAGNOSIS — R631 Polydipsia: Secondary | ICD-10-CM

## 2018-05-06 DIAGNOSIS — R3589 Other polyuria: Secondary | ICD-10-CM

## 2018-05-06 MED ORDER — EMPAGLIFLOZIN-LINAGLIPTIN 25-5 MG PO TABS: 1.0000 | ORAL_TABLET | Freq: Every day | ORAL | 5 refills | Status: DC

## 2018-05-06 NOTE — Telephone Encounter (Signed)
  Fax from [facility] 25-5 #[phone]

## 2018-06-04 ENCOUNTER — Other Ambulatory Visit: Payer: Self-pay | Admitting: Family Medicine

## 2018-06-04 DIAGNOSIS — I10 Essential (primary) hypertension: Principal | ICD-10-CM

## 2018-06-04 DIAGNOSIS — I152 Hypertension secondary to endocrine disorders: Secondary | ICD-10-CM

## 2018-06-04 DIAGNOSIS — E1159 Type 2 diabetes mellitus with other circulatory complications: Secondary | ICD-10-CM

## 2018-08-15 ENCOUNTER — Telehealth: Payer: Self-pay | Admitting: Family Medicine

## 2018-08-15 ENCOUNTER — Other Ambulatory Visit: Payer: Self-pay

## 2018-08-15 DIAGNOSIS — R358 Other polyuria: Secondary | ICD-10-CM

## 2018-08-15 DIAGNOSIS — R631 Polydipsia: Secondary | ICD-10-CM

## 2018-08-15 DIAGNOSIS — E119 Type 2 diabetes mellitus without complications: Secondary | ICD-10-CM

## 2018-08-15 DIAGNOSIS — R3589 Other polyuria: Secondary | ICD-10-CM

## 2018-08-15 MED ORDER — EMPAGLIFLOZIN-LINAGLIPTIN 25-5 MG PO TABS: 1.0000 | ORAL_TABLET | Freq: Every day | ORAL | 1 refills | Status: DC

## 2018-08-15 NOTE — Telephone Encounter (Signed)
Done and pt advised K[facility] 

## 2018-08-15 NOTE — Telephone Encounter (Signed)
Pt called and states that he would like a 90 day supply instead of a 30 day supply of his RX Glyxambi 25-5 mg,  Sent to [facility], [place] - [id number] pt can be reached at [phone]

## 2018-08-16 ENCOUNTER — Other Ambulatory Visit: Payer: Self-pay | Admitting: Family Medicine

## 2018-08-16 DIAGNOSIS — I152 Hypertension secondary to endocrine disorders: Secondary | ICD-10-CM

## 2018-08-16 DIAGNOSIS — I10 Essential (primary) hypertension: Principal | ICD-10-CM

## 2018-08-16 DIAGNOSIS — E1159 Type 2 diabetes mellitus with other circulatory complications: Secondary | ICD-10-CM

## 2018-08-21 ENCOUNTER — Ambulatory Visit: Payer: BLUE CROSS/BLUE SHIELD | Admitting: Family Medicine

## 2018-08-21 ENCOUNTER — Encounter: Payer: Self-pay | Admitting: Family Medicine

## 2018-08-21 VITALS — BP 116/72 | HR 75 | Temp 97.8°F | Ht 72.0 in | Wt 266.0 lb

## 2018-08-21 DIAGNOSIS — E119 Type 2 diabetes mellitus without complications: Secondary | ICD-10-CM | POA: Diagnosis not present

## 2018-08-21 DIAGNOSIS — E1169 Type 2 diabetes mellitus with other specified complication: Secondary | ICD-10-CM | POA: Diagnosis not present

## 2018-08-21 DIAGNOSIS — I152 Hypertension secondary to endocrine disorders: Secondary | ICD-10-CM

## 2018-08-21 DIAGNOSIS — Z789 Other specified health status: Secondary | ICD-10-CM | POA: Diagnosis not present

## 2018-08-21 DIAGNOSIS — I10 Essential (primary) hypertension: Secondary | ICD-10-CM

## 2018-08-21 DIAGNOSIS — E785 Hyperlipidemia, unspecified: Secondary | ICD-10-CM

## 2018-08-21 DIAGNOSIS — E1159 Type 2 diabetes mellitus with other circulatory complications: Secondary | ICD-10-CM | POA: Diagnosis not present

## 2018-08-21 DIAGNOSIS — E781 Pure hyperglyceridemia: Secondary | ICD-10-CM

## 2018-08-21 LAB — POCT GLYCOSYLATED HEMOGLOBIN (HGB A1C): HEMOGLOBIN A1C: 5.7 % — AB (ref 4.0–5.6)

## 2018-08-21 MED ORDER — PITAVASTATIN CALCIUM 4 MG PO TABS: 1.0000 | ORAL_TABLET | Freq: Every day | ORAL | 0 refills | Status: DC

## 2018-08-21 MED ORDER — EMPAGLIFLOZIN 10 MG PO TABS: 10.0000 mg | ORAL_TABLET | Freq: Every day | ORAL | 1 refills | Status: DC

## 2018-08-21 MED ORDER — ICOSAPENT ETHYL 1 G PO CAPS: 2.0000 | ORAL_CAPSULE | Freq: Two times a day (BID) | ORAL | 3 refills | Status: DC

## 2018-08-21 NOTE — Progress Notes (Signed)
  Subjective:    Patient ID: Trevor Johnson, male    DOB: 1961-03-13, 57 y.o.   MRN: [id number]  Trevor Johnson is a 57 y.o. male who presents for follow-up of Type 2 diabetes mellitus.  Patient is checking home blood sugars.   Home blood sugar records: meter records How often is blood sugars being checked: qod 80-120 Current symptoms/problems include none and have been unchanged. Daily foot checks:yes   Any foot concerns: none Last eye exam: never has an appointment set up for January Exercise: walking 3-4 miles a day He is doing well on his Glyxambi.  His previous A1c was quite good.  He continues to check his blood sugars regularly.  He is statin intolerant but has not tried Livalo.  He continues on lisinopril/HCTZ and is having no difficulty with that. The following portions of the patient's history were reviewed and updated as appropriate: allergies, current medications, past medical history, past social history and problem list. ROS as in subjective above.     Objective:    Physical Exam Alert and in no distress otherwise not examined.   Lab Review Diabetic Labs Latest Ref Rng & Units 04/19/2018 12/17/2017 09/28/2017 05/22/2017 05/23/2016  HbA1c 4.0 - 5.6 % 5.5 7.5 9.7 7.5 6.6  Microalbumin mg/L - - - 10.6 -  Micro/Creat Ratio - - - - 7.3 -  Chol <200 mg/dL - - - 232(H) -  HDL >40 mg/dL - - - 22(L) -  Calc LDL <100 mg/dL - - - NOT CALC -  Triglycerides <150 mg/dL - - - 1,214(H) -  Creatinine 0.70 - 1.33 mg/dL - - - 1.01 -   BP/Weight 04/19/2018 12/17/2017 11/08/2017 10/29/5571 10/27/252  Systolic BP 270 623 762 831 517  Diastolic BP 84 82 80 80 84  Wt. (Lbs) 256.6 246.6 241.2 241 248.6  BMI 35.79 33.44 32.71 32.69 33.72   Foot/eye exam completion dates 05/22/2017 01/19/2016  Foot Form Completion Done Done   A1c is 5.7 Trevor Johnson  reports that he has never smoked. He has never used smokeless tobacco. He reports that he does not drink alcohol or use drugs.     Assessment & Plan:    Diabetes mellitus without complication ([place]) - Plan: empagliflozin (JARDIANCE) 10 MG TABS tablet, POCT glycosylated hemoglobin (Hb A1C)  Hyperlipidemia associated with type 2 diabetes mellitus ([place]) - Plan: Pitavastatin Calcium (LIVALO) 4 MG TABS  Statin intolerance  Hypertension associated with diabetes ([place])  Hypertriglyceridemia - Plan: Icosapent Ethyl (VASCEPA) 1 g CAPS   1. Rx changes: Sample of the Livalo given.  We will also start him on Jardiance and stop Glyxambi it. 2. Education: Reviewed 'ABCs' of diabetes management (respective goals in parentheses):  A1C (<7), blood pressure (<130/80), and cholesterol (LDL <100). 3. Compliance at present is estimated to be excellent. Efforts to improve compliance (if necessary) will be directed atcontinuing his present exercise regimen Follow up: 4 months

## 2018-08-22 LAB — COMPREHENSIVE METABOLIC PANEL
A/G RATIO: 1.7 (ref 1.2–2.2)
ALBUMIN: 4.4 g/dL (ref 3.5–5.5)
ALK PHOS: 61 IU/L (ref 39–117)
ALT: 43 IU/L (ref 0–44)
AST: 34 IU/L (ref 0–40)
BUN/Creatinine Ratio: 13 (ref 9–20)
BUN: 15 mg/dL (ref 6–24)
Bilirubin Total: 0.7 mg/dL (ref 0.0–1.2)
CALCIUM: 9.6 mg/dL (ref 8.7–10.2)
CHLORIDE: 99 mmol/L (ref 96–106)
CO2: 22 mmol/L (ref 20–29)
Creatinine, Ser: 1.13 mg/dL (ref 0.76–1.27)
GFR calc Af Amer: 84 mL/min/{1.73_m2} (ref >59)
GFR calc non Af Amer: 73 mL/min/{1.73_m2} (ref >59)
GLOBULIN, TOTAL: 2.6 g/dL (ref 1.5–4.5)
Glucose: 107 mg/dL — ABNORMAL HIGH (ref 65–99)
POTASSIUM: 4.3 mmol/L (ref 3.5–5.2)
Sodium: 135 mmol/L (ref 134–144)
Total Protein: 7 g/dL (ref 6.0–8.5)

## 2018-08-22 LAB — CBC WITH DIFFERENTIAL/PLATELET
BASOS: 1 %
Basophils Absolute: 0 10*3/uL (ref 0.0–0.2)
EOS (ABSOLUTE): 0.1 10*3/uL (ref 0.0–0.4)
Eos: 3 %
HEMATOCRIT: 45.3 % (ref 37.5–51.0)
Hemoglobin: 16.5 g/dL (ref 13.0–17.7)
Immature Grans (Abs): 0 10*3/uL (ref 0.0–0.1)
Immature Granulocytes: 1 %
LYMPHS ABS: 1.6 10*3/uL (ref 0.7–3.1)
Lymphs: 29 %
MCH: 33.3 pg — ABNORMAL HIGH (ref 26.6–33.0)
MCHC: 36.4 g/dL — ABNORMAL HIGH (ref 31.5–35.7)
MCV: 91 fL (ref 79–97)
MONOS ABS: 0.5 10*3/uL (ref 0.1–0.9)
Monocytes: 10 %
Neutrophils Absolute: 3 10*3/uL (ref 1.4–7.0)
Neutrophils: 56 %
Platelets: 175 10*3/uL (ref 150–450)
RBC: 4.96 x10E6/uL (ref 4.14–5.80)
RDW: 13.5 % (ref 12.3–15.4)
WBC: 5.3 10*3/uL (ref 3.4–10.8)

## 2018-08-22 LAB — LIPID PANEL
CHOL/HDL RATIO: 7.3 ratio — AB (ref 0.0–5.0)
CHOLESTEROL TOTAL: 191 mg/dL (ref 100–199)
HDL: 26 mg/dL — ABNORMAL LOW (ref >39)
TRIGLYCERIDES: 573 mg/dL — AB (ref 0–149)

## 2018-09-26 ENCOUNTER — Other Ambulatory Visit: Payer: Self-pay | Admitting: Family Medicine

## 2018-09-26 DIAGNOSIS — N529 Male erectile dysfunction, unspecified: Secondary | ICD-10-CM

## 2018-09-27 MED ORDER — SILDENAFIL CITRATE 20 MG PO TABS: ORAL_TABLET | ORAL | 2 refills | Status: DC

## 2018-09-27 NOTE — Telephone Encounter (Signed)
[facility] is requesting to fill pt sildenafil. Please advise . KH 

## 2018-10-10 ENCOUNTER — Telehealth: Payer: Self-pay | Admitting: Family Medicine

## 2018-10-10 DIAGNOSIS — I152 Hypertension secondary to endocrine disorders: Secondary | ICD-10-CM

## 2018-10-10 DIAGNOSIS — I10 Essential (primary) hypertension: Principal | ICD-10-CM

## 2018-10-10 DIAGNOSIS — E781 Pure hyperglyceridemia: Secondary | ICD-10-CM

## 2018-10-10 DIAGNOSIS — E1159 Type 2 diabetes mellitus with other circulatory complications: Secondary | ICD-10-CM

## 2018-10-10 DIAGNOSIS — E119 Type 2 diabetes mellitus without complications: Secondary | ICD-10-CM

## 2018-10-10 MED ORDER — EMPAGLIFLOZIN 10 MG PO TABS: 10.0000 mg | ORAL_TABLET | Freq: Every day | ORAL | 1 refills | Status: DC

## 2018-10-10 MED ORDER — LISINOPRIL-HYDROCHLOROTHIAZIDE 10-12.5 MG PO TABS: ORAL_TABLET | ORAL | 3 refills | Status: DC

## 2018-10-10 MED ORDER — ICOSAPENT ETHYL 1 G PO CAPS: 2.0000 | ORAL_CAPSULE | Freq: Two times a day (BID) | ORAL | 3 refills | Status: DC

## 2018-10-10 NOTE — Telephone Encounter (Signed)
Wife called & pt needs refills Jardiance Lisinopril, Vascepa, 90 days to [facility] t# [phone] fax# [phone]

## 2018-10-17 ENCOUNTER — Other Ambulatory Visit: Payer: Self-pay

## 2018-10-17 DIAGNOSIS — E119 Type 2 diabetes mellitus without complications: Secondary | ICD-10-CM

## 2018-10-17 MED ORDER — EMPAGLIFLOZIN 10 MG PO TABS: 10.0000 mg | ORAL_TABLET | Freq: Every day | ORAL | 1 refills | Status: DC

## 2018-10-17 NOTE — Telephone Encounter (Signed)
Patient wife called and stated she called [facility] and they told her they had no record of Jardiance being sent to them . She would like it to be sent locally to [facility] on [address]. Please advise.

## 2018-10-30 ENCOUNTER — Other Ambulatory Visit: Payer: Self-pay

## 2018-10-30 DIAGNOSIS — I10 Essential (primary) hypertension: Principal | ICD-10-CM

## 2018-10-30 DIAGNOSIS — I152 Hypertension secondary to endocrine disorders: Secondary | ICD-10-CM

## 2018-10-30 DIAGNOSIS — E1159 Type 2 diabetes mellitus with other circulatory complications: Secondary | ICD-10-CM

## 2018-10-30 MED ORDER — LISINOPRIL-HYDROCHLOROTHIAZIDE 10-12.5 MG PO TABS: ORAL_TABLET | ORAL | 3 refills | Status: DC

## 2018-12-10 ENCOUNTER — Ambulatory Visit (INDEPENDENT_AMBULATORY_CARE_PROVIDER_SITE_OTHER): Payer: 59 | Admitting: Family Medicine

## 2018-12-10 ENCOUNTER — Encounter: Payer: Self-pay | Admitting: Family Medicine

## 2018-12-10 ENCOUNTER — Other Ambulatory Visit: Payer: Self-pay

## 2018-12-10 VITALS — BP 146/92 | HR 91 | Temp 98.0°F | Resp 16 | Wt 273.6 lb

## 2018-12-10 DIAGNOSIS — Z87442 Personal history of urinary calculi: Secondary | ICD-10-CM | POA: Diagnosis not present

## 2018-12-10 DIAGNOSIS — R300 Dysuria: Secondary | ICD-10-CM | POA: Diagnosis not present

## 2018-12-10 DIAGNOSIS — E119 Type 2 diabetes mellitus without complications: Secondary | ICD-10-CM

## 2018-12-10 DIAGNOSIS — N471 Phimosis: Secondary | ICD-10-CM

## 2018-12-10 LAB — POCT URINALYSIS DIP (PROADVANTAGE DEVICE)
BILIRUBIN UA: NEGATIVE
BILIRUBIN UA: NEGATIVE mg/dL
Blood, UA: NEGATIVE
Glucose, UA: 250 mg/dL — AB
Leukocytes, UA: NEGATIVE
Nitrite, UA: NEGATIVE
Protein Ur, POC: NEGATIVE mg/dL
Specific Gravity, Urine: 1.025
Urobilinogen, Ur: 3.5
pH, UA: 6 (ref 5.0–8.0)

## 2018-12-10 LAB — POCT GLYCOSYLATED HEMOGLOBIN (HGB A1C): Hemoglobin A1C: 6 % — AB (ref 4.0–5.6)

## 2018-12-10 NOTE — Progress Notes (Signed)
   Subjective:    Patient ID: Trevor Johnson, male    DOB: [birth year], 58 y.o.   MRN: [id number]  HPI He complains of a 1 day history of burning on urination but no discharge or urgency.  He states that he has not noted a decrease in his stream.  Does have a previous history of difficulty with phimosis and has had previous surgery for correction of that.  He also has a previous history of renal stones but apparently was stuck in the urethra probably due to the phimosis. He does have diabetes and has been taking his Jardiance regularly. Review of Systems     Objective:   Physical Exam Alert and in no distress.  Exam of the penis does show a very narrow urethral meatus Urine dipstick did show glucose. A1c is 6.0     Assessment & Plan:  Phimosis of penis - Plan: Ambulatory referral to Urology  Dysuria - Plan: POCT Urinalysis DIP (Proadvantage Device)  History of renal stone  Diabetes mellitus without complication ([place]) - Plan: POCT glycosylated hemoglobin (Hb A1C) He will continue on his present medication regimen.

## 2018-12-26 ENCOUNTER — Ambulatory Visit: Payer: BLUE CROSS/BLUE SHIELD | Admitting: Family Medicine

## 2019-01-16 DIAGNOSIS — N35811 Other urethral stricture, male, meatal: Secondary | ICD-10-CM | POA: Diagnosis not present

## 2019-01-16 DIAGNOSIS — R972 Elevated prostate specific antigen [PSA]: Secondary | ICD-10-CM | POA: Diagnosis not present

## 2019-04-04 ENCOUNTER — Other Ambulatory Visit: Payer: Self-pay | Admitting: Family Medicine

## 2019-04-04 DIAGNOSIS — E119 Type 2 diabetes mellitus without complications: Secondary | ICD-10-CM

## 2019-06-16 LAB — HM DIABETES EYE EXAM

## 2019-08-06 ENCOUNTER — Ambulatory Visit (INDEPENDENT_AMBULATORY_CARE_PROVIDER_SITE_OTHER): Payer: 59 | Admitting: Family Medicine

## 2019-08-06 ENCOUNTER — Encounter: Payer: Self-pay | Admitting: Family Medicine

## 2019-08-06 ENCOUNTER — Other Ambulatory Visit: Payer: Self-pay

## 2019-08-06 VITALS — BP 110/70 | HR 95 | Temp 97.7°F | Ht 72.25 in | Wt 267.6 lb

## 2019-08-06 DIAGNOSIS — T383X5D Adverse effect of insulin and oral hypoglycemic [antidiabetic] drugs, subsequent encounter: Secondary | ICD-10-CM

## 2019-08-06 DIAGNOSIS — I152 Hypertension secondary to endocrine disorders: Secondary | ICD-10-CM

## 2019-08-06 DIAGNOSIS — I10 Essential (primary) hypertension: Secondary | ICD-10-CM

## 2019-08-06 DIAGNOSIS — E785 Hyperlipidemia, unspecified: Secondary | ICD-10-CM

## 2019-08-06 DIAGNOSIS — E1169 Type 2 diabetes mellitus with other specified complication: Secondary | ICD-10-CM

## 2019-08-06 DIAGNOSIS — N529 Male erectile dysfunction, unspecified: Secondary | ICD-10-CM

## 2019-08-06 DIAGNOSIS — Z1211 Encounter for screening for malignant neoplasm of colon: Secondary | ICD-10-CM

## 2019-08-06 DIAGNOSIS — E781 Pure hyperglyceridemia: Secondary | ICD-10-CM

## 2019-08-06 DIAGNOSIS — E1159 Type 2 diabetes mellitus with other circulatory complications: Secondary | ICD-10-CM

## 2019-08-06 DIAGNOSIS — Z23 Encounter for immunization: Secondary | ICD-10-CM

## 2019-08-06 DIAGNOSIS — E119 Type 2 diabetes mellitus without complications: Secondary | ICD-10-CM

## 2019-08-06 DIAGNOSIS — Z789 Other specified health status: Secondary | ICD-10-CM

## 2019-08-06 LAB — POCT GLYCOSYLATED HEMOGLOBIN (HGB A1C): Hemoglobin A1C: 6.2 % — AB (ref 4.0–5.6)

## 2019-08-06 LAB — POCT UA - MICROALBUMIN
Albumin/Creatinine Ratio, Urine, POC: <7.3
Creatinine, POC: 6.8 mg/dL
Microalbumin Ur, POC: <5 mg/L

## 2019-08-06 MED ORDER — VASCEPA 1 G PO CAPS: 2.0000 | ORAL_CAPSULE | Freq: Two times a day (BID) | ORAL | 3 refills | Status: DC

## 2019-08-06 MED ORDER — LISINOPRIL-HYDROCHLOROTHIAZIDE 10-12.5 MG PO TABS: ORAL_TABLET | ORAL | 3 refills | Status: DC

## 2019-08-06 MED ORDER — JARDIANCE 10 MG PO TABS: 10.0000 mg | ORAL_TABLET | Freq: Every day | ORAL | 3 refills | Status: DC

## 2019-08-06 NOTE — Progress Notes (Signed)
Subjective:    Patient ID: Trevor Johnson, male    DOB: [birth year], 58 y.o.   MRN: [id number]  Trevor Johnson is a 58 y.o. male who presents for follow-up of Type 2 diabetes mellitus.  He does have a statin intolerance and did try Livalo but did not get back in touch with me as to whether it worked or not.  He does continue on Jardiance.  He does not tolerate Metformin.  He continues on Vascepa and is having no difficulty with that.  He does occasionally have difficulty with erectile dysfunction  Home blood sugar records: meter records 80-150 fasting  Current symptoms/problems include none this time. Daily foot checks: yes   Any foot concerns: none Exercise: walking daily Diet: reg. diet The following portions of the patient's history were reviewed and updated as appropriate: allergies, current medications, past medical history, past social history and problem list.  ROS as in subjective above.     Objective:    Physical Exam Alert and in no distress foot exam is normal The A1c is 6.2   Lab Review Diabetic Labs Latest Ref Rng & Units 12/10/2018 08/21/2018 04/19/2018 12/17/2017 09/28/2017  HbA1c 4.0 - 5.6 % 6.0(A) 5.7(A) 5.5 7.5 9.7  Microalbumin mg/L - - - - -  Micro/Creat Ratio - - - - - -  Chol 100 - 199 mg/dL - 191 - - -  HDL >39 mg/dL - 26(L) - - -  Calc LDL 0 - 99 mg/dL - Comment - - -  Triglycerides 0 - 149 mg/dL - 573(HH) - - -  Creatinine 0.76 - 1.27 mg/dL - 1.13 - - -   BP/Weight 12/10/2018 08/21/2018 04/19/2018 12/17/2017 [id number]  Systolic BP 123456 [id number] 123456 [id number] 123456  Diastolic BP 92 72 84 82 80  Wt. (Lbs) 273.6 266 256.6 246.6 241.2  BMI 37.11 36.08 35.79 33.44 32.71   Foot/eye exam completion dates Latest Ref Rng & Units 06/16/2019 05/22/2017  Eye Exam No Retinopathy No Retinopathy -  Foot Form Completion - - Done    Trevor Johnson  reports that he has never smoked. He has never used smokeless tobacco. He reports that he does not drink alcohol or use drugs.     Assessment &  Plan:    Diabetes mellitus without complication ([place]) - Plan: Lipid panel, POCT UA - Microalbumin, CBC with Differential, Comprehensive metabolic panel, empagliflozin (JARDIANCE) 10 MG TABS tablet, HgB A1c  Hypertension associated with diabetes ([place]) - Plan: CBC with Differential, Comprehensive metabolic panel, lisinopril-hydrochlorothiazide (ZESTORETIC) 10-12.5 MG tablet  Hypertriglyceridemia - Plan: Icosapent Ethyl (VASCEPA) 1 g CAPS  Hyperlipidemia associated with type 2 diabetes mellitus ([place]) - Plan: Lipid panel  Statin intolerance  Morbid obesity ([place]) - Plan: Lipid panel, CBC with Differential, Comprehensive metabolic panel  Need for vaccination against Streptococcus pneumoniae - Plan: Pneumococcal conjugate vaccine 13-valent  Need for influenza vaccination - Plan: Flu Vaccine QUAD 6+ mos PF IM (Fluarix Quad PF)  Screening for colon cancer - Plan: Cologuard  Adverse effect of metformin, subsequent encounter  Erectile dysfunction, unspecified erectile dysfunction type   1. Rx changes: A sample of the Valium was given.  He is to call me to let me know if he has any tolerance issues.  Discussed possibly using this 3 times per week. 2. Education: Reviewed 'ABCs' of diabetes management (respective goals in parentheses):  A1C (<7), blood pressure (<130/80), and cholesterol (LDL <100). 3. Compliance at present is estimated to be good. Efforts to improve compliance (if necessary)  will be directed at making further changes in his diet to get his waist size down to 36. 4. Follow up: 4 months

## 2019-08-07 LAB — CBC WITH DIFFERENTIAL/PLATELET
Basophils Absolute: 0.1 10*3/uL (ref 0.0–0.2)
Basos: 1 %
EOS (ABSOLUTE): 0.1 10*3/uL (ref 0.0–0.4)
Eos: 1 %
Hematocrit: 47.6 % (ref 37.5–51.0)
Hemoglobin: 16.8 g/dL (ref 13.0–17.7)
Immature Grans (Abs): 0 10*3/uL (ref 0.0–0.1)
Immature Granulocytes: 1 %
Lymphocytes Absolute: 1.9 10*3/uL (ref 0.7–3.1)
Lymphs: 29 %
MCH: 33.3 pg — ABNORMAL HIGH (ref 26.6–33.0)
MCHC: 35.3 g/dL (ref 31.5–35.7)
MCV: 94 fL (ref 79–97)
Monocytes Absolute: 0.6 10*3/uL (ref 0.1–0.9)
Monocytes: 9 %
Neutrophils Absolute: 3.9 10*3/uL (ref 1.4–7.0)
Neutrophils: 59 %
Platelets: 199 10*3/uL (ref 150–450)
RBC: 5.04 x10E6/uL (ref 4.14–5.80)
RDW: 13 % (ref 11.6–15.4)
WBC: 6.6 10*3/uL (ref 3.4–10.8)

## 2019-08-07 LAB — COMPREHENSIVE METABOLIC PANEL
ALT: 62 IU/L — ABNORMAL HIGH (ref 0–44)
AST: 40 IU/L (ref 0–40)
Albumin/Globulin Ratio: 1.7 (ref 1.2–2.2)
Albumin: 4.7 g/dL (ref 3.8–4.9)
Alkaline Phosphatase: 86 IU/L (ref 39–117)
BUN/Creatinine Ratio: 14 (ref 9–20)
BUN: 13 mg/dL (ref 6–24)
Bilirubin Total: 0.6 mg/dL (ref 0.0–1.2)
CO2: 19 mmol/L — ABNORMAL LOW (ref 20–29)
Calcium: 9.6 mg/dL (ref 8.7–10.2)
Chloride: 105 mmol/L (ref 96–106)
Creatinine, Ser: 0.94 mg/dL (ref 0.76–1.27)
GFR calc Af Amer: 104 mL/min/{1.73_m2} (ref >59)
GFR calc non Af Amer: 90 mL/min/{1.73_m2} (ref >59)
Globulin, Total: 2.7 g/dL (ref 1.5–4.5)
Glucose: 108 mg/dL — ABNORMAL HIGH (ref 65–99)
Potassium: 4.5 mmol/L (ref 3.5–5.2)
Sodium: 139 mmol/L (ref 134–144)
Total Protein: 7.4 g/dL (ref 6.0–8.5)

## 2019-08-07 LAB — LIPID PANEL
Chol/HDL Ratio: 5.9 ratio — ABNORMAL HIGH (ref 0.0–5.0)
Cholesterol, Total: 190 mg/dL (ref 100–199)
HDL: 32 mg/dL — ABNORMAL LOW (ref >39)
LDL Chol Calc (NIH): 80 mg/dL (ref 0–99)
Triglycerides: 485 mg/dL — ABNORMAL HIGH (ref 0–149)
VLDL Cholesterol Cal: 78 mg/dL — ABNORMAL HIGH (ref 5–40)

## 2019-08-15 ENCOUNTER — Encounter: Payer: Self-pay | Admitting: Family Medicine

## 2019-08-15 DIAGNOSIS — E1169 Type 2 diabetes mellitus with other specified complication: Secondary | ICD-10-CM

## 2019-09-09 ENCOUNTER — Other Ambulatory Visit: Payer: Self-pay | Admitting: Family Medicine

## 2019-09-09 DIAGNOSIS — E781 Pure hyperglyceridemia: Secondary | ICD-10-CM

## 2019-09-15 ENCOUNTER — Telehealth: Payer: Self-pay | Admitting: Family Medicine

## 2019-09-15 MED ORDER — LIVALO 2 MG PO TABS: ORAL_TABLET | ORAL | 3 refills | Status: DC

## 2019-09-15 NOTE — Telephone Encounter (Signed)
I assume that I gave him some to try and he did OK on them.Verify this. I called the Livalo in

## 2019-09-15 NOTE — Telephone Encounter (Signed)
On 11/23 you said called in Livalo 3 times per week.  Nothing on meds list.  Please advise.

## 2019-09-15 NOTE — Telephone Encounter (Signed)
Pt was advised Trevor Johnson 

## 2019-09-22 ENCOUNTER — Telehealth: Payer: Self-pay

## 2019-09-22 NOTE — Telephone Encounter (Signed)
Received fax from [place] stating that the pts. Livalo 2 mg is not covered by the insurance, I can do a PA on the medication or the alternatives are Atorvastatin, Lovastatin, Pravastatin, Rosuvastatin, or Simvastatin.

## 2019-09-22 NOTE — Telephone Encounter (Signed)
I have tried him on several other statins and he is intolerant.  This 1 seems to work

## 2019-09-23 NOTE — Telephone Encounter (Signed)
I have submitted a PA for the medication, I will let know once I hear back from the insurance company whether it will be approved or not.

## 2019-09-25 ENCOUNTER — Telehealth: Payer: Self-pay

## 2019-09-25 NOTE — Telephone Encounter (Signed)
I heard back form the PA I did on the Livalo 2mg  and unfortunately it was denied, only thing we can do now is either change the medication to one of the preferred alternatives or do an appeal. Or the pt. Could pay out of pocket and use good rx to get a discount. Please advise.

## 2019-09-29 NOTE — Telephone Encounter (Signed)
He has tried other statins and had difficulty with them.  Lets challenge this

## 2019-10-08 ENCOUNTER — Other Ambulatory Visit: Payer: Self-pay

## 2019-10-08 ENCOUNTER — Encounter: Payer: Self-pay | Admitting: Family Medicine

## 2019-10-08 ENCOUNTER — Ambulatory Visit (INDEPENDENT_AMBULATORY_CARE_PROVIDER_SITE_OTHER): Payer: 59 | Admitting: Family Medicine

## 2019-10-08 VITALS — Temp 97.3°F | Wt 267.0 lb

## 2019-10-08 DIAGNOSIS — M26621 Arthralgia of right temporomandibular joint: Secondary | ICD-10-CM

## 2019-10-08 DIAGNOSIS — K120 Recurrent oral aphthae: Secondary | ICD-10-CM | POA: Diagnosis not present

## 2019-10-08 NOTE — Progress Notes (Signed)
   Subjective:    Patient ID: Trevor Johnson, male    DOB: 07/25/1961, 59 y.o.   MRN: [id number]  HPI Documentation for virtual telephone encounter. Documentation for virtual audio and video telecommunications through [place] encounter:  The patient was located at home. The provider was located in the office. The patient did consent to this visit and is aware of possible charges through their insurance for this visit. The other persons participating in this telemedicine service were none. This virtual service is not related to other E/M service within previous 7 days. He complains of a several day history of right jaw pain especially when he eats anything.  He is also had some slight right ear discomfort and has noted some sores in his mouth on the both the left and right side.  No fever, chills, sore throat, cough or congestion.  His wife has been in the hospital and he has been under more than his normal amount of stress.    Review of Systems     Objective:   Physical Exam Alert and in no distress.  He points to the right TMJ as to where the pain is occurring.  I could also visually see his mouth and he did have one ulcerated lesion in the left cheek.       Assessment & Plan:  Oral aphthous ulcer  Arthralgia of right temporomandibular joint I explained what an aphthous ulcer is.  Recommend mouthwashes that have a little anesthetic in it.  He is also to avoid acid foods. Recommend 2 Aleve twice per day and soft foods for the TMJ pain he will call if further trouble.

## 2019-10-21 NOTE — Telephone Encounter (Signed)
I finally heard back from the appeal I submitted for the pts. Livalo and it was approved from 10/17/19-10/16/20.

## 2019-12-22 ENCOUNTER — Encounter: Payer: Self-pay | Admitting: Family Medicine

## 2020-02-03 ENCOUNTER — Encounter: Payer: Self-pay | Admitting: Family Medicine

## 2020-02-03 ENCOUNTER — Ambulatory Visit (INDEPENDENT_AMBULATORY_CARE_PROVIDER_SITE_OTHER): Payer: 59 | Admitting: Family Medicine

## 2020-02-03 ENCOUNTER — Other Ambulatory Visit: Payer: Self-pay

## 2020-02-03 VITALS — BP 110/78 | HR 87 | Temp 97.8°F | Wt 267.2 lb

## 2020-02-03 DIAGNOSIS — E119 Type 2 diabetes mellitus without complications: Secondary | ICD-10-CM | POA: Diagnosis not present

## 2020-02-03 DIAGNOSIS — T383X5D Adverse effect of insulin and oral hypoglycemic [antidiabetic] drugs, subsequent encounter: Secondary | ICD-10-CM

## 2020-02-03 DIAGNOSIS — E1159 Type 2 diabetes mellitus with other circulatory complications: Secondary | ICD-10-CM

## 2020-02-03 DIAGNOSIS — E781 Pure hyperglyceridemia: Secondary | ICD-10-CM

## 2020-02-03 DIAGNOSIS — I10 Essential (primary) hypertension: Secondary | ICD-10-CM

## 2020-02-03 DIAGNOSIS — I152 Hypertension secondary to endocrine disorders: Secondary | ICD-10-CM

## 2020-02-03 DIAGNOSIS — E1169 Type 2 diabetes mellitus with other specified complication: Secondary | ICD-10-CM

## 2020-02-03 DIAGNOSIS — E785 Hyperlipidemia, unspecified: Secondary | ICD-10-CM

## 2020-02-03 DIAGNOSIS — T383X5A Adverse effect of insulin and oral hypoglycemic [antidiabetic] drugs, initial encounter: Secondary | ICD-10-CM | POA: Insufficient documentation

## 2020-02-03 DIAGNOSIS — Z789 Other specified health status: Secondary | ICD-10-CM

## 2020-02-03 DIAGNOSIS — N529 Male erectile dysfunction, unspecified: Secondary | ICD-10-CM

## 2020-02-03 LAB — POCT GLYCOSYLATED HEMOGLOBIN (HGB A1C): Hemoglobin A1C: 6.2 % — AB (ref 4.0–5.6)

## 2020-02-03 MED ORDER — FENOFIBRATE 150 MG PO CAPS: 1.0000 | ORAL_CAPSULE | Freq: Every day | ORAL | 3 refills | Status: DC

## 2020-02-03 NOTE — Progress Notes (Signed)
Subjective:    Patient ID: CAVIN TRIPPE, male    DOB: [birth year], 59 y.o.   MRN: [id number]  PIETRO MOFFET is a 59 y.o. male who presents for follow-up of Type 2 diabetes mellitus.  Home blood sugar records: meter records 80-120 fasting  Current symptoms/problems include none at this time. Daily foot checks:yes   Any foot concerns: none Exercise: staying active Diet:fair he admits to dietary indiscretion.   He has started a walking program since he now has a dog and hopefully will help with weight.  He admits to not eating and exercising like he should.  He does have a previous history of Metformin as well as statin intolerance.  Presently he is taking Jardiance and having no difficulty with that.  He is also taking Vascepa as well as lisinopril/HCTZ.  He does occasionally use sildenafil. The following portions of the patient's history were reviewed and updated as appropriate: allergies, current medications, past medical history, past social history and problem list.  ROS as in subjective above.     Objective:    Physical Exam Alert and in no distress otherwise not examined.  Blood pressure 110/78, pulse 87, temperature 97.8 F (36.6 C), weight 267 lb 3.2 oz (121.2 kg), SpO2 96 %.  Lab Review Diabetic Labs Latest Ref Rng & Units 08/06/2019 12/10/2018 08/21/2018 04/19/2018 12/17/2017  HbA1c 4.0 - 5.6 % 6.2(A) 6.0(A) 5.7(A) 5.5 7.5  Microalbumin mg/L <5.0 - - - -  Micro/Creat Ratio - <7.3 - - - -  Chol 100 - 199 mg/dL 190 - 191 - -  HDL >39 mg/dL 32(L) - 26(L) - -  Calc LDL 0 - 99 mg/dL 80 - Comment - -  Triglycerides 0 - 149 mg/dL 485(H) - 573(HH) - -  Creatinine 0.76 - 1.27 mg/dL 0.94 - 1.13 - -   BP/Weight 02/03/2020 10/08/2019 08/06/2019 12/10/2018 [id number]  Systolic BP A999333 - A999333 123456 99991111  Diastolic BP 78 - 70 92 72  Wt. (Lbs) 267.2 267 267.6 273.6 266  BMI 35.99 35.96 36.04 37.11 36.08   Foot/eye exam completion dates Latest Ref Rng & Units 08/06/2019 06/16/2019  Eye Exam  No Retinopathy - No Retinopathy  Foot Form Completion - Done -    Taje  reports that he has never smoked. He has never used smokeless tobacco. He reports that he does not drink alcohol or use drugs.     Assessment & Plan:     Diabetes mellitus without complication ([place]) - Plan: POCT glycosylated hemoglobin (Hb A1C)  Hyperlipidemia associated with type 2 diabetes mellitus ([place])  Hypertension associated with diabetes ([place])  Hypertriglyceridemia - Plan: Fenofibrate 150 MG CAPS  Statin intolerance  Morbid obesity ([facility])  Adverse effect of metformin, subsequent encounter  Type 2 diabetes mellitus with other specified complication, without long-term current use of insulin ([place])  Vasculogenic erectile dysfunction, unspecified vasculogenic erectile dysfunction type   1. Rx changes: Fenofibrate added to his regimen. 2. Education: Reviewed 'ABCs' of diabetes management (respective goals in parentheses):  A1C (<7), blood pressure (<130/80), and cholesterol (LDL <100). 3. Compliance at present is estimated to be good. Efforts to improve compliance (if necessary) will be directed at increased exercise.  Hopefully his new dog will help him to become more physically active. 4. Follow up: 4 months  I again stressed the need for him to make further diet and exercise changes.  He expresses his desire to watch his grandchildren get married.  I explained that he needs to do further  work to ensure that that happens.  Discussed the possibility of him losing enough weight to able to stop his diabetes medications.

## 2020-08-12 ENCOUNTER — Encounter: Payer: 59 | Admitting: Family Medicine

## 2020-08-17 ENCOUNTER — Other Ambulatory Visit: Payer: Self-pay | Admitting: Family Medicine

## 2020-08-17 DIAGNOSIS — E119 Type 2 diabetes mellitus without complications: Secondary | ICD-10-CM

## 2020-08-18 ENCOUNTER — Other Ambulatory Visit: Payer: Self-pay | Admitting: Family Medicine

## 2020-08-18 DIAGNOSIS — I152 Hypertension secondary to endocrine disorders: Secondary | ICD-10-CM

## 2020-09-30 LAB — HM DIABETES EYE EXAM

## 2020-10-22 ENCOUNTER — Encounter: Payer: Self-pay | Admitting: Family Medicine

## 2020-10-24 ENCOUNTER — Telehealth: Payer: Self-pay

## 2020-10-24 NOTE — Telephone Encounter (Signed)
P.A. LIVALO  

## 2020-10-25 ENCOUNTER — Other Ambulatory Visit: Payer: Self-pay | Admitting: Family Medicine

## 2020-10-25 DIAGNOSIS — E781 Pure hyperglyceridemia: Secondary | ICD-10-CM

## 2020-10-28 NOTE — Telephone Encounter (Signed)
P.A. denied, states pt needs to try 3 formulary alteratives or have intolerance or contraindication which patient has.  Appeal letter typed

## 2020-11-12 NOTE — Telephone Encounter (Signed)
Appeal approved til 11/11/21, called pharmacy no active Rx for Livalo.  Pt picked up Vascepa on 10/28/20 for $45 for #120.  Pt has been switched to [employer]

## 2020-11-13 ENCOUNTER — Other Ambulatory Visit: Payer: Self-pay | Admitting: Family Medicine

## 2020-11-13 DIAGNOSIS — E781 Pure hyperglyceridemia: Secondary | ICD-10-CM

## 2020-11-17 ENCOUNTER — Ambulatory Visit (INDEPENDENT_AMBULATORY_CARE_PROVIDER_SITE_OTHER): Payer: 59 | Admitting: Family Medicine

## 2020-11-17 ENCOUNTER — Encounter: Payer: Self-pay | Admitting: Family Medicine

## 2020-11-17 ENCOUNTER — Other Ambulatory Visit: Payer: Self-pay

## 2020-11-17 VITALS — BP 110/78 | HR 80 | Temp 96.3°F | Ht 71.0 in | Wt 264.4 lb

## 2020-11-17 DIAGNOSIS — Z789 Other specified health status: Secondary | ICD-10-CM

## 2020-11-17 DIAGNOSIS — Z23 Encounter for immunization: Secondary | ICD-10-CM | POA: Diagnosis not present

## 2020-11-17 DIAGNOSIS — Z1211 Encounter for screening for malignant neoplasm of colon: Secondary | ICD-10-CM | POA: Diagnosis not present

## 2020-11-17 DIAGNOSIS — E1159 Type 2 diabetes mellitus with other circulatory complications: Secondary | ICD-10-CM

## 2020-11-17 DIAGNOSIS — N529 Male erectile dysfunction, unspecified: Secondary | ICD-10-CM

## 2020-11-17 DIAGNOSIS — E1169 Type 2 diabetes mellitus with other specified complication: Secondary | ICD-10-CM

## 2020-11-17 DIAGNOSIS — E11319 Type 2 diabetes mellitus with unspecified diabetic retinopathy without macular edema: Secondary | ICD-10-CM | POA: Diagnosis not present

## 2020-11-17 DIAGNOSIS — T383X5D Adverse effect of insulin and oral hypoglycemic [antidiabetic] drugs, subsequent encounter: Secondary | ICD-10-CM

## 2020-11-17 DIAGNOSIS — Z0000 Encounter for general adult medical examination without abnormal findings: Secondary | ICD-10-CM

## 2020-11-17 DIAGNOSIS — E781 Pure hyperglyceridemia: Secondary | ICD-10-CM

## 2020-11-17 DIAGNOSIS — E669 Obesity, unspecified: Secondary | ICD-10-CM | POA: Diagnosis not present

## 2020-11-17 DIAGNOSIS — E785 Hyperlipidemia, unspecified: Secondary | ICD-10-CM

## 2020-11-17 DIAGNOSIS — I152 Hypertension secondary to endocrine disorders: Secondary | ICD-10-CM

## 2020-11-17 DIAGNOSIS — Z87442 Personal history of urinary calculi: Secondary | ICD-10-CM

## 2020-11-17 LAB — POCT GLYCOSYLATED HEMOGLOBIN (HGB A1C): Hemoglobin A1C: 6.5 % — AB (ref 4.0–5.6)

## 2020-11-17 MED ORDER — EMPAGLIFLOZIN 10 MG PO TABS: 10.0000 mg | ORAL_TABLET | Freq: Every day | ORAL | 1 refills | Status: DC

## 2020-11-17 MED ORDER — FENOFIBRATE 150 MG PO CAPS: 1.0000 | ORAL_CAPSULE | Freq: Every day | ORAL | 3 refills | Status: DC

## 2020-11-17 MED ORDER — LISINOPRIL-HYDROCHLOROTHIAZIDE 10-12.5 MG PO TABS
ORAL_TABLET | ORAL | 3 refills | Status: DC
Start: 2020-11-17 — End: 2021-11-23

## 2020-11-17 MED ORDER — ICOSAPENT ETHYL 1 G PO CAPS: 2.0000 g | ORAL_CAPSULE | Freq: Two times a day (BID) | ORAL | 3 refills | Status: DC

## 2020-11-17 NOTE — Progress Notes (Signed)
   Subjective:    Patient ID: Trevor Johnson, male    DOB: [birth year], 60 y.o.   MRN: [id number]  HPI He is here for complete examination.  He does have underlying diabetes and is statin intolerant as well as Metformin intolerant.  Presently he is taking Jardiance.  He states that his diet and exercise are about as good as he can do and does not plan on making any further changes to that.  He does check his feet regularly and has had follow-up with ophthalmology concerning his underlying retinopathy.  Does not smoke or drink.  He continues on Vascepa and fenofibrate as well as lisinopril/HCTZ.  Does occasionally use sildenafil with good results.  His marriage is going well.  He has not had a colonoscopy.  Apparently the Cologuard was sent to him but he did not complete it and is interested in getting a colonoscopy.  Family and social history as well as health maintenance immunizations was reviewed.   Review of Systems  All other systems reviewed and are negative.      Objective:   Physical Exam Alert and in no distress. Tympanic membranes and canals are normal. Pharyngeal area is normal. Neck is supple without adenopathy or thyromegaly. Cardiac exam shows a regular sinus rhythm without murmurs or gallops. Lungs are clear to auscultation.  Foot exam is recorded and is normal.  Abdominal exam shows no masses or tenderness with normal bowel sounds. 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, without long-term current use of insulin, unspecified retinopathy severity ([place]) - Plan: CBC with  Differential/Platelet, Comprehensive metabolic panel, Lipid panel, POCT glycosylated hemoglobin (Hb A1C), empagliflozin (JARDIANCE) 10 MG TABS tablet  Hyperlipidemia associated with type 2 diabetes mellitus ([place]) - Plan: icosapent Ethyl (VASCEPA) 1 g capsule, Fenofibrate 150 MG CAPS  Hypertension associated with diabetes ([place]) - Plan: lisinopril-hydrochlorothiazide (ZESTORETIC) 10-12.5 MG tablet  History of renal stone  Vasculogenic erectile dysfunction, unspecified vasculogenic erectile dysfunction type  Hypertriglyceridemia - Plan: icosapent Ethyl (VASCEPA) 1 g capsule, Fenofibrate 150 MG CAPS  Need for Tdap vaccination - Plan: Tdap vaccine greater than or equal to 7yo IM  Need for vaccination against Streptococcus pneumoniae - Plan: Pneumococcal polysaccharide vaccine 23-valent greater than or equal to 2yo subcutaneous/IM  Continue on present medication regimen.  Briefly discussed diet and exercise with him that he is comfortable with where he is at right now so further discussion is really not necessary.

## 2020-11-18 LAB — CBC WITH DIFFERENTIAL/PLATELET
Basophils Absolute: 0.1 10*3/uL (ref 0.0–0.2)
Basos: 1 %
EOS (ABSOLUTE): 0.2 10*3/uL (ref 0.0–0.4)
Eos: 3 %
Hematocrit: 47.4 % (ref 37.5–51.0)
Hemoglobin: 16.3 g/dL (ref 13.0–17.7)
Immature Grans (Abs): 0 10*3/uL (ref 0.0–0.1)
Immature Granulocytes: 0 %
Lymphocytes Absolute: 1.6 10*3/uL (ref 0.7–3.1)
Lymphs: 28 %
MCH: 33 pg (ref 26.6–33.0)
MCHC: 34.4 g/dL (ref 31.5–35.7)
MCV: 96 fL (ref 79–97)
Monocytes Absolute: 0.5 10*3/uL (ref 0.1–0.9)
Monocytes: 9 %
Neutrophils Absolute: 3.2 10*3/uL (ref 1.4–7.0)
Neutrophils: 59 %
Platelets: 169 10*3/uL (ref 150–450)
RBC: 4.94 x10E6/uL (ref 4.14–5.80)
RDW: 13 % (ref 11.6–15.4)
WBC: 5.5 10*3/uL (ref 3.4–10.8)

## 2020-11-18 LAB — COMPREHENSIVE METABOLIC PANEL
ALT: 56 IU/L — ABNORMAL HIGH (ref 0–44)
AST: 37 IU/L (ref 0–40)
Albumin/Globulin Ratio: 1.6 (ref 1.2–2.2)
Albumin: 4.4 g/dL (ref 3.8–4.9)
Alkaline Phosphatase: 83 IU/L (ref 44–121)
BUN/Creatinine Ratio: 16 (ref 9–20)
BUN: 14 mg/dL (ref 6–24)
Bilirubin Total: 0.5 mg/dL (ref 0.0–1.2)
CO2: 17 mmol/L — ABNORMAL LOW (ref 20–29)
Calcium: 9.2 mg/dL (ref 8.7–10.2)
Chloride: 100 mmol/L (ref 96–106)
Creatinine, Ser: 0.86 mg/dL (ref 0.76–1.27)
GFR calc Af Amer: 111 mL/min/{1.73_m2} (ref >59)
GFR calc non Af Amer: 96 mL/min/{1.73_m2} (ref >59)
Globulin, Total: 2.7 g/dL (ref 1.5–4.5)
Glucose: 128 mg/dL — ABNORMAL HIGH (ref 65–99)
Potassium: 4.3 mmol/L (ref 3.5–5.2)
Sodium: 135 mmol/L (ref 134–144)
Total Protein: 7.1 g/dL (ref 6.0–8.5)

## 2020-11-18 LAB — LIPID PANEL
Chol/HDL Ratio: 9.1 ratio — ABNORMAL HIGH (ref 0.0–5.0)
Cholesterol, Total: 210 mg/dL — ABNORMAL HIGH (ref 100–199)
HDL: 23 mg/dL — ABNORMAL LOW (ref >39)
Triglycerides: 805 mg/dL (ref 0–149)

## 2020-11-23 ENCOUNTER — Telehealth: Payer: 59 | Admitting: Family Medicine

## 2020-11-23 ENCOUNTER — Other Ambulatory Visit: Payer: Self-pay

## 2020-11-25 ENCOUNTER — Other Ambulatory Visit: Payer: Self-pay

## 2020-11-25 ENCOUNTER — Ambulatory Visit (INDEPENDENT_AMBULATORY_CARE_PROVIDER_SITE_OTHER): Payer: 59 | Admitting: Family Medicine

## 2020-11-25 ENCOUNTER — Encounter: Payer: Self-pay | Admitting: Family Medicine

## 2020-11-25 VITALS — BP 130/82 | HR 87 | Temp 98.0°F | Wt 262.6 lb

## 2020-11-25 DIAGNOSIS — E785 Hyperlipidemia, unspecified: Secondary | ICD-10-CM

## 2020-11-25 DIAGNOSIS — E1169 Type 2 diabetes mellitus with other specified complication: Secondary | ICD-10-CM

## 2020-11-25 MED ORDER — EZETIMIBE 10 MG PO TABS: 10.0000 mg | ORAL_TABLET | Freq: Every day | ORAL | 3 refills | Status: DC

## 2020-11-25 NOTE — Progress Notes (Signed)
   Subjective:    Patient ID: Trevor Johnson, male    DOB: Sep 02, 1961, 60 y.o.   MRN: [id number]  HPI He is here for consult concerning elevated triglycerides.  He has been taking Vascepa at half dosing due to shortage of that.  He continues on fenofibrate and has not had any difficulty with that.  He does not smoke.  Apparently there is a family history of difficulty with hypertriglyceridemia as well as statin intolerance.   Review of Systems     Objective:   Physical Exam Alert and in no distress otherwise not examined       Assessment & Plan:  Hyperlipidemia associated with type 2 diabetes mellitus (Trevor Johnson) - Plan: ezetimibe (ZETIA) 10 MG tablet I reviewed the data with him and his risk concerning pancreatitis as well as his underlying diabetes.  He will start taking Vascepa 2 pills twice per day, I will place him on Zetia and encouraged him to make major changes in his eating habits in regard to carbohydrates.  Recheck here in 2 months.

## 2020-11-29 ENCOUNTER — Encounter: Payer: Self-pay | Admitting: Gastroenterology

## 2021-01-11 ENCOUNTER — Encounter: Payer: Self-pay | Admitting: Family Medicine

## 2021-01-23 DIAGNOSIS — C189 Malignant neoplasm of colon, unspecified: Secondary | ICD-10-CM

## 2021-01-23 HISTORY — DX: Malignant neoplasm of colon, unspecified: C18.9

## 2021-01-25 ENCOUNTER — Ambulatory Visit: Payer: 59 | Admitting: Family Medicine

## 2021-01-27 ENCOUNTER — Other Ambulatory Visit: Payer: Self-pay

## 2021-01-27 ENCOUNTER — Ambulatory Visit (AMBULATORY_SURGERY_CENTER): Payer: Self-pay | Admitting: *Deleted

## 2021-01-27 VITALS — Ht 71.0 in | Wt 263.0 lb

## 2021-01-27 DIAGNOSIS — Z1211 Encounter for screening for malignant neoplasm of colon: Secondary | ICD-10-CM

## 2021-01-27 MED ORDER — NA SULFATE-K SULFATE-MG SULF 17.5-3.13-1.6 GM/177ML PO SOLN: ORAL | 0 refills | Status: DC

## 2021-01-27 NOTE — Progress Notes (Signed)

## 2021-02-04 ENCOUNTER — Encounter: Payer: Self-pay | Admitting: Gastroenterology

## 2021-02-08 ENCOUNTER — Encounter: Payer: Self-pay | Admitting: Gastroenterology

## 2021-02-08 ENCOUNTER — Other Ambulatory Visit: Payer: Self-pay

## 2021-02-08 ENCOUNTER — Ambulatory Visit (AMBULATORY_SURGERY_CENTER): Payer: 59 | Admitting: Gastroenterology

## 2021-02-08 VITALS — BP 116/70 | HR 60 | Temp 98.6°F | Resp 14 | Ht 71.0 in | Wt 263.0 lb

## 2021-02-08 DIAGNOSIS — D124 Benign neoplasm of descending colon: Secondary | ICD-10-CM | POA: Diagnosis not present

## 2021-02-08 DIAGNOSIS — D122 Benign neoplasm of ascending colon: Secondary | ICD-10-CM

## 2021-02-08 DIAGNOSIS — D120 Benign neoplasm of cecum: Secondary | ICD-10-CM

## 2021-02-08 DIAGNOSIS — C187 Malignant neoplasm of sigmoid colon: Secondary | ICD-10-CM | POA: Diagnosis not present

## 2021-02-08 DIAGNOSIS — Z1211 Encounter for screening for malignant neoplasm of colon: Secondary | ICD-10-CM | POA: Diagnosis not present

## 2021-02-08 DIAGNOSIS — D123 Benign neoplasm of transverse colon: Secondary | ICD-10-CM | POA: Diagnosis not present

## 2021-02-08 DIAGNOSIS — D125 Benign neoplasm of sigmoid colon: Secondary | ICD-10-CM

## 2021-02-08 MED ORDER — SODIUM CHLORIDE 0.9 % IV SOLN: 500.0000 mL | Freq: Once | INTRAVENOUS | Status: DC

## 2021-02-08 NOTE — Progress Notes (Signed)
Pt's states no medical or surgical changes since previsit or office visit.  CHECK-IN-AER  VITAL SIGNS-CW 

## 2021-02-08 NOTE — Op Note (Signed)
[place] Patient Name: Trevor Johnson Procedure Date: 02/08/2021 11:01 AM MRN: [id number] Endoscopist: Remo Lipps P. Havery Moros , MD Age: 60 Referring MD:  Date of Birth: 1961-06-16 Gender: Male Account #: [id number] Procedure:                Colonoscopy Indications:              Screening for colorectal malignant neoplasm, This                            is the patient's first colonoscopy Medicines:                Monitored Anesthesia Care Procedure:                Pre-Anesthesia Assessment:                           - Prior to the procedure, a History and Physical                            was performed, and patient medications and                            allergies were reviewed. The patient's tolerance of                            previous anesthesia was also reviewed. The risks                            and benefits of the procedure and the sedation                            options and risks were discussed with the patient.                            All questions were answered, and informed consent                            was obtained. Prior Anticoagulants: The patient has                            taken no previous anticoagulant or antiplatelet                            agents. ASA Grade Assessment: II - A patient with                            mild systemic disease. After reviewing the risks                            and benefits, the patient was deemed in                            satisfactory condition to undergo the procedure.  After obtaining informed consent, the colonoscope                            was passed under direct vision. Throughout the                            procedure, the patient's blood pressure, pulse, and                            oxygen saturations were monitored continuously. The                            Olympus CF-HQ190 [id number]) Colonoscope was                            introduced through the  anus and advanced to the the                            cecum, identified by appendiceal orifice and                            ileocecal valve. The colonoscopy was performed                            without difficulty. The patient tolerated the                            procedure well. The quality of the bowel                            preparation was good. The ileocecal valve,                            appendiceal orifice, and rectum were photographed. Scope In: 11:10:20 AM Scope Out: 11:53:48 AM Scope Withdrawal Time: 0 hours 35 minutes 49 seconds  Total Procedure Duration: 0 hours 43 minutes 28 seconds  Findings:                 The perianal and digital rectal examinations were                            normal.                           A 12 mm polyp was found in the cecum. The polyp was                            sessile. The polyp was removed with a cold snare.                            Resection and retrieval were complete.                           A 3 mm polyp was found in the ascending colon. The  polyp was sessile. The polyp was removed with a                            cold snare. Resection and retrieval were complete.                           A 4 mm polyp was found in the hepatic flexure. The                            polyp was sessile. The polyp was removed with a                            cold snare. Resection and retrieval were complete.                           There was a medium-sized lipoma, in the proximal                            transverse colon.                           Three sessile polyps were found in the transverse                            colon. The polyps were 3 to 5 mm in size. These                            polyps were removed with a cold snare. Resection                            and retrieval were complete.                           Scattered medium-mouthed diverticula were found in                             the transverse colon and left colon.                           A 5 mm polyp was found in the descending colon. The                            polyp was sessile. The polyp was removed with a                            cold snare. Resection and retrieval were complete.                           A 12 mm polyp was found in the descending colon.                            The polyp was sessile. The polyp was removed with a  cold snare mostly but had a hard time cutting                            through it, burst of cautery used to complete                            resection. Resection and retrieval were complete.                           A 8 mm polyp was found in the sigmoid colon. The                            polyp was pedunculated with a thick stalk. The                            polyp head appeared atypical / irregular. The polyp                            was removed with a hot snare. Resection and                            retrieval were complete. Area distal to the polyp                            was tattooed with an injection of Spot (carbon                            black).                           Internal hemorrhoids were found during                            retroflexion. The hemorrhoids were moderate.                           The colon revealed excessive looping in the right                            colon.                           The exam was otherwise without abnormality. Complications:            No immediate complications. Estimated blood loss:                            Minimal. Estimated Blood Loss:     Estimated blood loss was minimal. Impression:               - One 12 mm polyp in the cecum, removed with a cold                            snare. Resected and retrieved.                           -  One 3 mm polyp in the ascending colon, removed                            with a cold snare. Resected and retrieved.                            - One 4 mm polyp at the hepatic flexure, removed                            with a cold snare. Resected and retrieved.                           - Medium-sized lipoma in the ascending colon.                           - Three 3 to 5 mm polyps in the transverse colon,                            removed with a cold snare. Resected and retrieved.                           - Diverticulosis in the transverse colon and in the                            left colon.                           - One 5 mm polyp in the descending colon, removed                            with a cold snare. Resected and retrieved.                           - One 12 mm polyp in the descending colon, removed                            with a cold snare. Resected and retrieved.                           - One 8 mm polyp in the sigmoid colon, removed with                            a hot snare. Resected and retrieved. Tattooed.                           - Internal hemorrhoids.                           - There was significant looping of the colon.                           - The examination was otherwise normal. Recommendation:           -  Patient has a contact number available for                            emergencies. The signs and symptoms of potential                            delayed complications were discussed with the                            patient. Return to normal activities tomorrow.                            Written discharge instructions were provided to the                            patient.                           - Resume previous diet.                           - Continue present medications.                           - Await pathology results.                           - No ibuprofen, naproxen, or other non-steroidal                            anti-inflammatory drugs for 2 weeks after polyp                            removal. Remo Lipps P. Aleksia Freiman, MD 02/08/2021 12:03:52 PM This report has  been signed electronically.

## 2021-02-08 NOTE — Progress Notes (Signed)
PT taken to PACU. Monitors in place. VSS. Report given to RN. 

## 2021-02-08 NOTE — Patient Instructions (Signed)
Handouts given for polyps, hemorrhoids and diverticulosis.  No NSAIDS (Non-Steroidal anti-inflammatory drugs) for 2 weeks.  (These include, aspirin, aspirin-containing products, ibuprofen, advil, motrin, naproxen, aleve, goody powders, etc) Tylenol is ok to take as needed, see label for instructions.   YOU HAD AN ENDOSCOPIC PROCEDURE TODAY AT [facility]:   Refer to the procedure report that was given to you for any specific questions about what was found during the examination.  If the procedure report does not answer your questions, please call your gastroenterologist to clarify.  If you requested that your care partner not be given the details of your procedure findings, then the procedure report has been included in a sealed envelope for you to review at your convenience later.  YOU SHOULD EXPECT: Some feelings of bloating in the abdomen. Passage of more gas than usual.  Walking can help get rid of the air that was put into your GI tract during the procedure and reduce the bloating. If you had a lower endoscopy (such as a colonoscopy or flexible sigmoidoscopy) you may notice spotting of blood in your stool or on the toilet paper. If you underwent a bowel prep for your procedure, you may not have a normal bowel movement for a few days.  Please Note:  You might notice some irritation and congestion in your nose or some drainage.  This is from the oxygen used during your procedure.  There is no need for concern and it should clear up in a day or so.  SYMPTOMS TO REPORT IMMEDIATELY:   Following lower endoscopy (colonoscopy or flexible sigmoidoscopy):  Excessive amounts of blood in the stool  Significant tenderness or worsening of abdominal pains  Swelling of the abdomen that is new, acute  Fever of 100F or higher  For urgent or emergent issues, a gastroenterologist can be reached at any hour by calling [phone]. Do not use MyChart messaging for urgent concerns.     DIET:  We do recommend a small meal at first, but then you may proceed to your regular diet.  Drink plenty of fluids but you should avoid alcoholic beverages for 24 hours.  ACTIVITY:  You should plan to take it easy for the rest of today and you should NOT DRIVE or use heavy machinery until tomorrow (because of the sedation medicines used during the test).    FOLLOW UP: Our staff will call the number listed on your records 48-72 hours following your procedure to check on you and address any questions or concerns that you may have regarding the information given to you following your procedure. If we do not reach you, we will leave a message.  We will attempt to reach you two times.  During this call, we will ask if you have developed any symptoms of COVID 19. If you develop any symptoms (ie: fever, flu-like symptoms, shortness of breath, cough etc.) before then, please call [phone].  If you test positive for Covid 19 in the 2 weeks post procedure, please call and report this information to [place].    If any biopsies were taken you will be contacted by phone or by letter within the next 1-3 weeks.  Please call us at [phone] if you have not heard about the biopsies in 3 weeks.    SIGNATURES/CONFIDENTIALITY: You and/or your care partner have signed paperwork which will be entered into your electronic medical record.  These signatures attest to the fact that that the information above on your After  Visit Summary has been reviewed and is understood.  Full responsibility of the confidentiality of this discharge information lies with you and/or your care-partner. 

## 2021-02-08 NOTE — Progress Notes (Signed)
Called to room to assist during endoscopic procedure.  Patient ID and intended procedure confirmed with present staff. Received instructions for my participation in the procedure from the performing physician.  

## 2021-02-10 ENCOUNTER — Telehealth: Payer: Self-pay

## 2021-02-10 ENCOUNTER — Telehealth: Payer: Self-pay | Admitting: *Deleted

## 2021-02-10 NOTE — Telephone Encounter (Signed)
NO ANSWER, MESSAGE LEFT FOR PATIENT. 

## 2021-02-10 NOTE — Telephone Encounter (Signed)
  Follow up Call-  Call back number 02/08/2021  Post procedure Call Back phone  # [phone]  Permission to leave phone message Yes  Some recent data might be hidden     Patient questions:  Do you have a fever, pain , or abdominal swelling? No. Pain Score  0 *  Have you tolerated food without any problems? Yes.    Have you been able to return to your normal activities? Yes.    Do you have any questions about your discharge instructions: Diet   No. Medications  No. Follow up visit  No.  Do you have questions or concerns about your Care? No.  Actions: * If pain score is 4 or above: No action needed, pain <4.  1. Have you developed a fever since your procedure? no  2.   Have you had an respiratory symptoms (SOB or cough) since your procedure? no  3.   Have you tested positive for COVID 19 since your procedure no  4.   Have you had any family members/close contacts diagnosed with the COVID 19 since your procedure?  no   If yes to any of these questions please route to Joylene John, RN and Joella Prince, RN

## 2021-02-16 ENCOUNTER — Other Ambulatory Visit (INDEPENDENT_AMBULATORY_CARE_PROVIDER_SITE_OTHER): Payer: 59

## 2021-02-16 ENCOUNTER — Ambulatory Visit (INDEPENDENT_AMBULATORY_CARE_PROVIDER_SITE_OTHER)
Admission: RE | Admit: 2021-02-16 | Discharge: 2021-02-16 | Disposition: A | Discharge status: Home / Self Care | Payer: 59 | Source: Ambulatory Visit | Attending: Gastroenterology | Admitting: Gastroenterology

## 2021-02-16 ENCOUNTER — Other Ambulatory Visit: Payer: Self-pay

## 2021-02-16 DIAGNOSIS — C189 Malignant neoplasm of colon, unspecified: Secondary | ICD-10-CM

## 2021-02-16 LAB — BASIC METABOLIC PANEL
BUN: 18 mg/dL (ref 6–23)
CO2: 23 mEq/L (ref 19–32)
Calcium: 10.1 mg/dL (ref 8.4–10.5)
Chloride: 101 mEq/L (ref 96–112)
Creatinine, Ser: 1.02 mg/dL (ref 0.40–1.50)
GFR: 81.28 mL/min (ref >60.00)
Glucose, Bld: 121 mg/dL — ABNORMAL HIGH (ref 70–99)
Potassium: 4.1 mEq/L (ref 3.5–5.1)
Sodium: 134 mEq/L — ABNORMAL LOW (ref 135–145)

## 2021-02-16 MED ORDER — IOHEXOL 300 MG/ML  SOLN
100.0000 mL | Freq: Once | INTRAMUSCULAR | Status: AC | PRN
  Administered 2021-02-16: 100 mL via INTRAVENOUS

## 2021-02-17 LAB — CEA: CEA: 1.1 ng/mL

## 2021-02-18 ENCOUNTER — Other Ambulatory Visit: Payer: Self-pay

## 2021-02-18 DIAGNOSIS — R7989 Other specified abnormal findings of blood chemistry: Secondary | ICD-10-CM

## 2021-02-18 DIAGNOSIS — C189 Malignant neoplasm of colon, unspecified: Secondary | ICD-10-CM

## 2021-02-18 DIAGNOSIS — K760 Fatty (change of) liver, not elsewhere classified: Secondary | ICD-10-CM

## 2021-02-22 ENCOUNTER — Other Ambulatory Visit (INDEPENDENT_AMBULATORY_CARE_PROVIDER_SITE_OTHER): Payer: 59

## 2021-02-22 DIAGNOSIS — K760 Fatty (change of) liver, not elsewhere classified: Secondary | ICD-10-CM

## 2021-02-22 DIAGNOSIS — C189 Malignant neoplasm of colon, unspecified: Secondary | ICD-10-CM

## 2021-02-22 DIAGNOSIS — R7989 Other specified abnormal findings of blood chemistry: Secondary | ICD-10-CM | POA: Diagnosis not present

## 2021-02-22 LAB — IBC + FERRITIN
Ferritin: 337.3 ng/mL — ABNORMAL HIGH (ref 22.0–322.0)
Iron: 109 ug/dL (ref 42–165)
Saturation Ratios: 31.1 % (ref 20.0–50.0)
Transferrin: 250 mg/dL (ref 212.0–360.0)

## 2021-02-23 DIAGNOSIS — K7460 Unspecified cirrhosis of liver: Secondary | ICD-10-CM

## 2021-02-23 HISTORY — DX: Unspecified cirrhosis of liver: K74.60

## 2021-02-25 LAB — HEPATITIS A ANTIBODY, TOTAL: Hepatitis A AB,Total: NONREACTIVE

## 2021-02-25 LAB — HEPATITIS B SURFACE ANTIGEN: Hepatitis B Surface Ag: NONREACTIVE

## 2021-02-25 LAB — HEPATITIS C ANTIBODY
Hepatitis C Ab: NONREACTIVE
SIGNAL TO CUT-OFF: 0.01 (ref <1.00)

## 2021-02-25 LAB — ALPHA-1-ANTITRYPSIN: A-1 Antitrypsin, Ser: 163 mg/dL (ref 83–199)

## 2021-02-25 LAB — ANTI-NUCLEAR AB-TITER (ANA TITER): ANA Titer 1: 1:320 {titer} — ABNORMAL HIGH

## 2021-02-25 LAB — ANA: Anti Nuclear Antibody (ANA): POSITIVE — AB

## 2021-02-25 LAB — HEPATITIS B SURFACE ANTIBODY,QUALITATIVE: Hep B S Ab: NONREACTIVE

## 2021-02-25 LAB — ANTI-SMOOTH MUSCLE ANTIBODY, IGG: Actin (Smooth Muscle) Antibody (IGG): <20 U (ref <20)

## 2021-02-25 LAB — IGG: IgG (Immunoglobin G), Serum: 1215 mg/dL (ref 600–1640)

## 2021-03-03 ENCOUNTER — Other Ambulatory Visit: Payer: Self-pay

## 2021-03-03 DIAGNOSIS — R7989 Other specified abnormal findings of blood chemistry: Secondary | ICD-10-CM

## 2021-03-03 DIAGNOSIS — K760 Fatty (change of) liver, not elsewhere classified: Secondary | ICD-10-CM

## 2021-03-04 ENCOUNTER — Other Ambulatory Visit: Payer: 59

## 2021-03-04 DIAGNOSIS — K760 Fatty (change of) liver, not elsewhere classified: Secondary | ICD-10-CM

## 2021-03-04 DIAGNOSIS — R7989 Other specified abnormal findings of blood chemistry: Secondary | ICD-10-CM

## 2021-03-08 ENCOUNTER — Encounter: Payer: Self-pay | Admitting: Gastroenterology

## 2021-03-08 ENCOUNTER — Ambulatory Visit (INDEPENDENT_AMBULATORY_CARE_PROVIDER_SITE_OTHER): Payer: 59 | Admitting: Gastroenterology

## 2021-03-08 VITALS — BP 120/74 | HR 64 | Ht 71.0 in | Wt 260.8 lb

## 2021-03-08 DIAGNOSIS — Z86010 Personal history of colonic polyps: Secondary | ICD-10-CM

## 2021-03-08 DIAGNOSIS — K760 Fatty (change of) liver, not elsewhere classified: Secondary | ICD-10-CM | POA: Diagnosis not present

## 2021-03-08 DIAGNOSIS — C187 Malignant neoplasm of sigmoid colon: Secondary | ICD-10-CM

## 2021-03-08 DIAGNOSIS — K7460 Unspecified cirrhosis of liver: Secondary | ICD-10-CM | POA: Diagnosis not present

## 2021-03-08 DIAGNOSIS — Z23 Encounter for immunization: Secondary | ICD-10-CM

## 2021-03-08 NOTE — Progress Notes (Signed)
HPI :  60 year old male here for a follow-up visit for malignant colon polyp and suspicion for cirrhosis.  He saw me on May 17 for routine screening colonoscopy.  He had 9 polyps removed, largest 12 mm.  He had an 8 mm irregular appearing polyp in the sigmoid colon which was pedunculated.  This was removed with a hot snare and tattooed.  Pathology results showed invasive moderately differentiated adenocarcinoma with invasion into the submucosa of the stalk with a clear margin of only 1 mm.  He tolerated the procedure well without any problems.  He underwent staging CT which showed no evidence of metastatic disease.  CEA was negative.  His case was referred to the cancer conference for discussion to determine if he would warrant colorectal surgery involvement versus surveillance colonoscopy.  His case should be presented this week and I have not heard back from that meeting yet.  His grandfather had colon cancer but no other colon cancer in the family is aware of.  Otherwise incidentally noted on CT scan was that he had diffuse fatty liver and some early changes of cirrhosis.  Spleen was normal.  Platelets are normal.  He states he was told he had fatty liver a long time ago.  He has had mild elevation in his liver enzymes, ALT to 50-60s over the past few years.  He states his weight has been up to 300 pounds in the past, but has fluctuated, more recently has been around 250 to 260 pounds.  He denies any significant alcohol use.  Denies any history of long-term steroids.  He takes multiple medications for hyperlipidemia including fenofibrate which has been on for 6 months.  He states this pill is quite expensive for him and he was hoping to get off it due to cost.  Otherwise he had a lab work-up done as below, he had an elevated ferritin which led to hemochromatosis genetic testing which is pending.  He is not immune to hepatitis a and B and is due for those vaccines.  He has never had an EGD before.   Historically he appears to be compensated, denies any history of jaundice, ascites, encephalopathy, or internal bleeding.  Prior work-up: Colonoscopy 02/08/21 - One 12 mm polyp in the cecum, removed with a cold snare. Resected and retrieved. - One 3 mm polyp in the ascending colon, removed with a cold snare. Resected and retrieved. - One 4 mm polyp at the hepatic flexure, removed with a cold snare. Resected and retrieved. - Medium-sized lipoma in the ascending colon. - Three 3 to 5 mm polyps in the transverse colon, removed with a cold snare. Resected and retrieved. - Diverticulosis in the transverse colon and in the left colon. - One 5 mm polyp in the descending colon, removed with a cold snare. Resected and retrieved. - One 12 mm polyp in the descending colon, removed with a cold snare. Resected and retrieved. - One 8 mm polyp in the sigmoid colon, removed with a hot snare. Resected and retrieved. Tattooed. - Internal hemorrhoids. - There was significant looping of the colon. - The examination was otherwise normal.  1. Surgical [P], colon, transverse, ascending, hepatic flexure, descending, polyp (6) - TUBULAR ADENOMA(S) WITHOUT HIGH-GRADE DYSPLASIA OR MALIGNANCY - HYPERPLASTIC POLYP 2. Surgical [P], colon, cecum, polyp (1) - SESSILE SERRATED POLYP(S) WITHOUT CYTOLOGIC DYSPLASIA 3. Surgical [P], colon, descending, polyp (1) - TUBULAR ADENOMA(S) WITHOUT HIGH-GRADE DYSPLASIA OR MALIGNANCY 4. Surgical [P], colon, sigmoid, polyp (1) - INVASIVE MODERATELY DIFFERENTIATED ADENOCARCINOMA,  0.7 CM ARISING IN A TUBULAR ADENOMA WITH HIGH-GRADE DYSPLASIA (MALIGNANT POLYP) - CARCINOMA INVADES INTO SUBMUCOSA FOR A DEPTH OF 0.7 CM - NEGATIVE FOR AREAS OF POOR DIFFERENTIATION/ HIGH GRADE TUMOR BUDDING - NO DEFINITE EVIDENCE OF LYMPHOVASCULAR INVASION - THE CAUTERIZED RESECTION MARGIN (POLYP BASE) IS 0.1 CM FROM THE CARCINOMA  CT C/A/P 02/16/21 - IMPRESSION: 1. No acute intrathoracic, abdominal, or  pelvic pathology. No evidence of metastatic disease in the chest, abdomen, or pelvis. 2. Fatty liver with early changes of cirrhosis.   CEA 1.1  ANA (+) 1::320 IgG 1215 Smooth muscle antibody (-) Ferritin 337.3 Iron 109 Iron sat 31.1 Hemochromatosis genetic testing pending Hep B surface antigen (-) Hep B surface antibody (-) Hep C AB (-) Hep A total antibody (-) Alpha one antitrypsin 163     Past Medical History:  Diagnosis Date   Allergy    RHINITIS   Cirrhosis of liver ([place]) 02/2021   Colon cancer ([place])    malignant colon polyp   Diabetes mellitus    Fatty liver    Hypertension    Hypertriglyceridemia    Obesity    Renal stone      Past Surgical History:  Procedure Laterality Date   URETHRAL STRICTURE DILATATION     Family History  Problem Relation Age of Onset   Colon cancer Maternal Grandfather    Colon polyps Neg Hx    Esophageal cancer Neg Hx    Rectal cancer Neg Hx    Stomach cancer Neg Hx    Social History   Tobacco Use   Smoking status: Never   Smokeless tobacco: Never  Vaping Use   Vaping Use: Never used  Substance Use Topics   Alcohol use: No   Drug use: No   Current Outpatient Medications  Medication Sig Dispense Refill   empagliflozin (JARDIANCE) 10 MG TABS tablet Take 1 tablet (10 mg total) by mouth daily. 90 tablet 1   ezetimibe (ZETIA) 10 MG tablet Take 1 tablet (10 mg total) by mouth daily. 90 tablet 3   Fenofibrate 150 MG CAPS Take 1 capsule (150 mg total) by mouth daily. 90 capsule 3   icosapent Ethyl (VASCEPA) 1 g capsule Take 2 capsules (2 g total) by mouth 2 (two) times daily. 180 capsule 3   lisinopril-hydrochlorothiazide (ZESTORETIC) 10-12.5 MG tablet TAKE 1 TABLET BY MOUTH EVERY DAY 90 tablet 3   sildenafil (REVATIO) 20 MG tablet Take up to 5 pills as needed daily 30 tablet 2   No current facility-administered medications for this visit.   Allergies  Allergen Reactions   Lipitor [Atorvastatin] Itching   Crestor  [Rosuvastatin Calcium] Rash     Review of Systems: All systems reviewed and negative except where noted in HPI.    CT CHEST ABDOMEN PELVIS W CONTRAST  Result Date: 02/16/2021 CLINICAL DATA:  60 year old male with colon cancer staging. EXAM: CT CHEST, ABDOMEN, AND PELVIS WITH CONTRAST TECHNIQUE: Multidetector CT imaging of the chest, abdomen and pelvis was performed following the standard protocol during bolus administration of intravenous contrast. CONTRAST:  130mL OMNIPAQUE IOHEXOL 300 MG/ML  SOLN COMPARISON:  CT abdomen pelvis dated 12/17/2009. FINDINGS: CT CHEST FINDINGS Cardiovascular: There is no cardiomegaly or pericardial effusion. The thoracic aorta is unremarkable. The origins of the great vessels of the aortic arch appear patent as visualized. The central pulmonary arteries are unremarkable for the degree of opacification. Mediastinum/Nodes: No hilar or mediastinal adenopathy. The esophagus and the thyroid gland are grossly unremarkable. No mediastinal fluid collection.  Lungs/Pleura: The lungs are clear. There is no pleural effusion or pneumothorax. The central airways are patent. Musculoskeletal: Mild degenerative changes of the spine. No acute osseous pathology. CT ABDOMEN PELVIS FINDINGS No intra-abdominal free air or free fluid. Hepatobiliary: Diffuse fatty liver. No intrahepatic biliary ductal dilatation. Apparent faint area of lobulation and lower attenuation involving the anterior liver contour adjacent to the gallbladder (sagittal 60/6), likely represents early changes of cirrhosis. An underlying liver lesion is less likely. The gallbladder is unremarkable. Pancreas: Unremarkable. No pancreatic ductal dilatation or surrounding inflammatory changes. Spleen: Normal in size without focal abnormality. Adrenals/Urinary Tract: The adrenal glands are unremarkable. There is no hydronephrosis on either side. There is symmetric enhancement and excretion of contrast by both kidneys. There is a 2 cm  right renal interpolar cyst. The visualized ureters and the urinary bladder appear unremarkable. Stomach/Bowel: There is no bowel obstruction or active inflammation. The appendix is normal. Vascular/Lymphatic: The abdominal aorta and IVC are unremarkable. No portal venous gas. Minimally enlarged lymph node in the upper abdomen measures 13 mm (62/2). Mildly enlarged periportal lymph node measures 15 mm in short axis. These are slightly the year compared to the CT of 2011. No other adenopathy. Reproductive: The prostate and seminal vesicles are grossly unremarkable. No pelvic mass. Other: Status post prior umbilical hernia repair. No hernia. Musculoskeletal: Mild degenerative changes of the spine. No acute osseous pathology. Grade 1 L4-L5 anterolisthesis. IMPRESSION: 1. No acute intrathoracic, abdominal, or pelvic pathology. No evidence of metastatic disease in the chest, abdomen, or pelvis. 2. Fatty liver with early changes of cirrhosis. Electronically Signed   By: Anner Crete M.D.   On: 02/16/2021 16:06    Physical Exam: BP 120/74   Pulse 64   Ht 5\' 11"  (1.803 m)   Wt 260 lb 12.8 oz (118.3 kg)   SpO2 98%   BMI 36.37 kg/m  Constitutional: Pleasant,well-developed, male in no acute distress. HNeurological: Alert and oriented to person place and time. Skin: Skin is warm and dry. No rashes noted. Psychiatric: Normal mood and affect. Behavior is normal.  Lab Results  Component Value Date   WBC 5.5 11/17/2020   HGB 16.3 11/17/2020   HCT 47.4 11/17/2020   MCV 96 11/17/2020   PLT 169 11/17/2020    Lab Results  Component Value Date   ALT 56 (H) 11/17/2020   AST 37 11/17/2020   ALKPHOS 83 11/17/2020   BILITOT 0.5 11/17/2020    Lab Results  Component Value Date   CREATININE 1.02 02/16/2021   BUN 18 02/16/2021   NA 134 (L) 02/16/2021   K 4.1 02/16/2021   CL 101 02/16/2021   CO2 23 02/16/2021    No results found for: INR, PROTIME    ASSESSMENT AND PLAN: 60 year old male here for  reassessment of the following:  Malignant colon polyp / colon cancer History of colon polyps Cirrhosis Fatty liver  As above, found to have an atypical pedunculated polyp on routine screening exam, found to have adenocarcinoma with invasion to the stalk but a 1 mm margin noted.  CT scan shows no evidence of metastatic disease.  I have referred his case to the cancer conference hopefully that is being discussed this week to determine if he warrants colorectal surgery consultation versus close surveillance with colonoscopy.  I will tentatively book him for a flex sig to be done in 3 months to survey this area if he does not go for surgery in the interim.  I will get back to him  as soon as I hear the results from the conference.    Incidentally noted on his CT scan was fatty liver with subtle changes of cirrhosis.  His spleen is normal, platelets normal, mild elevation in liver enzymes.  He has compensated cirrhosis and we discussed what this is.  He is at risk for decompensation and HCC moving forward.  Discussed that weight loss would be in his best interest to reduce inflammatory changes from the fatty liver, he is interested in this and will refer him to the weight loss clinic.  Otherwise his lab work-up is as above, hemochromatosis genetic testing pending but suspect elevated ferritin could be due to fatty liver.  He is not immune to hepatitis A or B and recommend vaccination to those, he is agreeable to that today.  He is due for a screening upper endoscopy to rule out varices.  I discussed risk benefits of endoscopy and anesthesia with him and he wants to proceed, we will tentatively schedule this to be done with his flex sig.  He understands the need for surveillance ultrasound in 6 months and monitoring of his labs.  I recommend he minimize his alcohol intake.  He is taking fenofibrate which is contraindicated with liver disease, will relay this to his primary care and recommend he stop this for now,  he does not think he can afford it anyway.  I will see him every 6 months moving forward for this.  He agreed with the plan, all questions answered.  Plan: - await decision from cancer conference discussion, in regards to referral for surgical consultation vs. close surveillance flex sig - tentatively plan for flex sig and upper endoscopy in 3 months - stop fenofibrate, will discuss with his primary care physician - Twinrix vaccine today - referral to weight loss clinic - minimize alcohol use - await hemochromatosis genetic testing - repeat US along with AFP, liver labs in 6 months  [place] Cellar, MD [facility]  CC: Denita Lung, MD

## 2021-03-08 NOTE — Patient Instructions (Addendum)
If you are age 58 or older, your body mass index should be between 23-30. Your Body mass index is 36.37 kg/m. If this is out of the aforementioned range listed, please consider follow up with your Primary Care Provider.  If you are age 78 or younger, your body mass index should be between 19-25. Your Body mass index is 36.37 kg/m. If this is out of the aformentioned range listed, please consider follow up with your Primary Care Provider.   __________________________________________________________  The [facility] providers would like to encourage you to use [facility] to communicate with providers for non-urgent requests or questions.  Due to long hold times on the telephone, sending your provider a message by [facility] may be a faster and more efficient way to get a response.  Please allow 48 business hours for a response.  Please remember that this is for non-urgent requests.  ___________________________________________________________  Trevor Johnson have been scheduled for a flexible sigmoidoscopy. Please follow the written instructions given to you at your visit today. If you use inhalers (even only as needed), please bring them with you on the day of your procedure.  We have given you your first of 3 Twinrix injections to protect you against Hepatitis A and B.  Your 2nd injection has been scheduled for Thursday, 7-14 at 9:30 am.     Discontinue fenofibrate.   You will be due for an Ultrasound of your liver and labs in December. We will call you to scheduled when it is time to have these done.   We are referring you to [facility].  They will contact you directly to schedule an appointment.  It may take a week or more;  Please feel free to contact us if you have not heard from them within 2 weeks and we will follow up.    Thank you for entrusting me with your care and for choosing [facility], Dr.  Cellar

## 2021-03-09 ENCOUNTER — Other Ambulatory Visit: Payer: Self-pay

## 2021-03-09 NOTE — Progress Notes (Signed)
The proposed treatment discussed in conference is for discussion purposes only and is not a binding recommendation.  The patients have not been physically examined, or presented with their treatment options.  Therefore, final treatment plans cannot be decided.   

## 2021-03-10 ENCOUNTER — Encounter: Payer: Self-pay | Admitting: Gastroenterology

## 2021-03-10 NOTE — Progress Notes (Signed)
No worries.  Thanks.

## 2021-03-10 NOTE — Progress Notes (Signed)
I called the patient and discussed recommendations from multidisciplinary conference.  He strongly wishes to proceed with endoscopic surveillance and wants to avoid surgery if at all possible but he is willing to sit with the surgeons and discussed the situation so he can hear from them.  We will place referral to [facility] surgery and keep him on the schedule for endoscopic surveillance in a few months.  [place], can you please refer this patient to Dr. Marcello Moores of [facility], she is aware of his case.  Thanks

## 2021-03-10 NOTE — Progress Notes (Signed)
Case was discussed at multidisciplinary conference this week. Reviewed the patient's colonoscopy and pathology and radiology. Consensus from the group was that it seems the polyp with invasive cancer was resected completely based on the margin status.  There was no LVI noted either in the pathology.  It was felt that endoscopic surveillance closely would not be an unreasonable approach however an opportunity to discuss the case with one of the colorectal surgeons and consider surgical evaluation and management was felt to also be important for the patient to at least hear what the risks may be if surgical management is not offered.  I will forward this information to the patient's primary gastroenterologist, Dr. Havery Moros so that he may discuss this further with the patient and see if he would like the referral and continue with his planned endoscopic surveillance for now.  Justice Britain, MD [facility] Advanced Endoscopy Office # [phone]

## 2021-03-11 LAB — HEMOCHROMATOSIS DNA-PCR(C282Y,H63D)

## 2021-03-11 NOTE — Progress Notes (Signed)
Referral, records, demographic, and insurance information faxed to [facility] with attention to Dr. Marcello Moores.

## 2021-04-07 ENCOUNTER — Ambulatory Visit (INDEPENDENT_AMBULATORY_CARE_PROVIDER_SITE_OTHER): Payer: 59 | Admitting: Gastroenterology

## 2021-04-07 DIAGNOSIS — Z23 Encounter for immunization: Secondary | ICD-10-CM | POA: Diagnosis not present

## 2021-04-07 NOTE — Patient Instructions (Signed)
FOLLOW UP:  Please call the office at [phone] to schedule your 6 month nurse visit appointment.  Hepatitis A; Hepatitis B Vaccine injection What is this medication? HEPATITIS A VACCINE; HEPATITIS B VACCINE (hep uh TAHY tis A vak SEEN; hep uh TAHY tis B vak SEEN) is a vaccine to protect from an infection with the hepatitis A and B virus. This vaccine does not contain the live viruses. Itwill not cause a hepatitis infection. This medicine may be used for other purposes; ask your health care provider orpharmacist if you have questions. COMMON BRAND NAME(S): Twinrix What should I tell my care team before I take this medication? They need to know if you have any of these conditions: bleeding disorder fever or infection heart disease immune system problems an unusual or allergic reaction to hepatitis A or B vaccine, neomycin, yeast, thimerosal, other medicines, foods, dyes, or preservatives pregnant or trying to get pregnant breast-feeding How should I use this medication? This vaccine is for injection into a muscle. It is given by a health careprofessional. A copy of Vaccine Information Statements will be given before each vaccination.Read this sheet carefully each time. The sheet may change frequently. Talk to your pediatrician regarding the use of this medicine in children.Special care may be needed. Overdosage: If you think you have taken too much of this medicine contact apoison control center or emergency room at once. NOTE: This medicine is only for you. Do not share this medicine with others. What if I miss a dose? It is important not to miss your dose. Call your doctor or health careprofessional if you are unable to keep an appointment. What may interact with this medication? medicines that suppress your immune function like adalimumab, anakinra, infliximab medicines to treat cancer steroid medicines like prednisone or cortisone This list may not describe all possible  interactions. Give your health care provider a list of all the medicines, herbs, non-prescription drugs, or dietary supplements you use. Also tell them if you smoke, drink alcohol, or use illegaldrugs. Some items may interact with your medicine. What should I watch for while using this medication? See your health care provider for all shots of this vaccine as directed. You must have 3 to 4 shots of this vaccine for protection from hepatitis A and B infection. Tell your doctor right away if you have any serious or unusual sideeffects after getting this vaccine. What side effects may I notice from receiving this medication? Side effects that you should report to your doctor or health care professionalas soon as possible: allergic reactions like skin rash, itching or hives, swelling of the face, lips, or tongue breathing problems confused, irritated fast, irregular heartbeat flu-like syndrome numb, tingling pain seizures Side effects that usually do not require medical attention (report to yourdoctor or health care professional if they continue or are bothersome): diarrhea fever headache loss of appetite muscle pain nausea pain, redness, swelling, or irritation at site where injected tiredness This list may not describe all possible side effects. Call your doctor for medical advice about side effects. You may report side effects to FDA at1-800-FDA-1088. Where should I keep my medication? This drug is given in a hospital or clinic and will not be stored at home. NOTE: This sheet is a summary. It may not cover all possible information. If you have questions about this medicine, talk to your doctor, pharmacist, orhealth care provider.  2022 Elsevier/Gold Standard (2008-01-24 15:21:37)

## 2021-04-07 NOTE — Progress Notes (Signed)
Pt presents today for nurse visit for an injection. Per Dr. Havery Moros, 2nd injection of Twinrix given IM in L deltoid today by A. Shondrea Steinert, LPN. Denies any adverse reactions or discomfort.

## 2021-05-15 ENCOUNTER — Other Ambulatory Visit: Payer: Self-pay | Admitting: Family Medicine

## 2021-05-15 DIAGNOSIS — E11319 Type 2 diabetes mellitus with unspecified diabetic retinopathy without macular edema: Secondary | ICD-10-CM

## 2021-05-17 ENCOUNTER — Ambulatory Visit: Payer: 59 | Admitting: Family Medicine

## 2021-05-17 NOTE — Progress Notes (Deleted)
  Subjective:    Patient ID: Trevor Johnson, male    DOB: [birth year], 60 y.o.   MRN: [id number]  [name] is a 60 y.o. male who presents for follow-up of Type 2 diabetes mellitus.  Home blood sugar records: {diabetes glucometry results:16657} Current symptoms/problems include {Symptoms; diabetes:14075} and have been {Desc; course:15616}. Daily foot checks:   Any foot concerns: *** Exercise: {types:19826} Diet: The following portions of the patient's history were reviewed and updated as appropriate: allergies, current medications, past medical history, past social history and problem list.  ROS as in subjective above.     Objective:    Physical Exam Alert and in no distress otherwise not examined.  There were no vitals taken for this visit.  Lab Review Diabetic Labs Latest Ref Rng & Units 02/16/2021 11/17/2020 02/03/2020 08/06/2019 12/10/2018  HbA1c 4.0 - 5.6 % - 6.5(A) 6.2(A) 6.2(A) 6.0(A)  Microalbumin mg/L - - - <5.0 -  Micro/Creat Ratio - - - - <7.3 -  Chol 100 - 199 mg/dL - 210(H) - 190 -  HDL >39 mg/dL - 23(L) - 32(L) -  Calc LDL 0 - 99 mg/dL - Comment(A) - 80 -  Triglycerides 0 - 149 mg/dL - 805(HH) - 485(H) -  Creatinine 0.40 - 1.50 mg/dL 1.02 0.86 - 0.94 -  GFR >60.00 mL/min 81.28 - - - -   BP/Weight 03/08/2021 02/08/2021 01/27/2021 11/25/2020 0000000  Systolic BP [id number] - [id number]  Diastolic BP 74 70 - 82 78  Wt. (Lbs) 260.8 263 263 262.6 264.4  BMI 36.37 36.68 36.68 36.63 36.88   Foot/eye exam completion dates Latest Ref Rng & Units 11/17/2020 09/30/2020  Eye Exam No Retinopathy - Retinopathy(A)  Foot Form Completion - Done -    Cec[place]  reports that he has never smoked. He has never used smokeless tobacco. He reports that he does not drink alcohol and does not use drugs.     Assessment & Plan:    No diagnosis found.  Rx changes: {none:33079} Education: Reviewed 'ABCs' of diabetes management (respective goals in parentheses):  A1C (<7), blood pressure  (<130/80), and cholesterol (LDL <100). Compliance at present is estimated to be {good/fair/poor:33178}. Efforts to improve compliance (if necessary) will be directed at {compliance:16716}. Follow up: {NUMBERS; 0-10:33138} {time:11}

## 2021-05-22 ENCOUNTER — Encounter: Payer: Self-pay | Admitting: Family Medicine

## 2021-05-31 ENCOUNTER — Encounter: Payer: 59 | Admitting: Gastroenterology

## 2021-06-07 ENCOUNTER — Encounter: Payer: 59 | Admitting: Gastroenterology

## 2021-06-24 ENCOUNTER — Encounter: Payer: Self-pay | Admitting: Gastroenterology

## 2021-06-24 ENCOUNTER — Telehealth: Payer: Self-pay | Admitting: Gastroenterology

## 2021-06-24 NOTE — Telephone Encounter (Signed)
Called and spoke to patient.  He was at work and could not hear well or write down new instructions.  Indicated I would send new instructions to MyChart. Per Dr. Havery Moros,  Patient will take 2 fleet enemas the morning of the procedure: one 2 hours before arrival time and one 1 hour before arrival time and Take 2 dulcolax tablets  the evening before at 6:00am in place of Mag Citrate.  New instructions send to pt.

## 2021-06-28 ENCOUNTER — Other Ambulatory Visit: Payer: Self-pay

## 2021-06-28 ENCOUNTER — Encounter: Payer: Self-pay | Admitting: Gastroenterology

## 2021-06-28 ENCOUNTER — Ambulatory Visit (AMBULATORY_SURGERY_CENTER): Payer: 59 | Admitting: Gastroenterology

## 2021-06-28 VITALS — BP 114/86 | HR 61 | Temp 98.2°F | Resp 14 | Ht 71.0 in | Wt 260.0 lb

## 2021-06-28 DIAGNOSIS — K2091 Esophagitis, unspecified with bleeding: Secondary | ICD-10-CM

## 2021-06-28 DIAGNOSIS — K648 Other hemorrhoids: Secondary | ICD-10-CM

## 2021-06-28 DIAGNOSIS — K317 Polyp of stomach and duodenum: Secondary | ICD-10-CM

## 2021-06-28 DIAGNOSIS — C187 Malignant neoplasm of sigmoid colon: Secondary | ICD-10-CM | POA: Diagnosis not present

## 2021-06-28 DIAGNOSIS — K259 Gastric ulcer, unspecified as acute or chronic, without hemorrhage or perforation: Secondary | ICD-10-CM

## 2021-06-28 DIAGNOSIS — K7460 Unspecified cirrhosis of liver: Secondary | ICD-10-CM

## 2021-06-28 DIAGNOSIS — K5730 Diverticulosis of large intestine without perforation or abscess without bleeding: Secondary | ICD-10-CM | POA: Diagnosis not present

## 2021-06-28 MED ORDER — OMEPRAZOLE 20 MG PO CPDR: 20.0000 mg | DELAYED_RELEASE_CAPSULE | Freq: Every day | ORAL | 3 refills | Status: DC

## 2021-06-28 MED ORDER — SODIUM CHLORIDE 0.9 % IV SOLN: 500.0000 mL | Freq: Once | INTRAVENOUS | Status: DC

## 2021-06-28 NOTE — Patient Instructions (Signed)
Thank you for letting us take care of your healthcare needs today. Please see handouts given to you on Esophagitis and Hemorrhoids. You may pickup your new prescription for Omeprazole 20 mg daily at  your pharmacy.   YOU HAD AN ENDOSCOPIC PROCEDURE TODAY AT [facility]:   Refer to the procedure report that was given to you for any specific questions about what was found during the examination.  If the procedure report does not answer your questions, please call your gastroenterologist to clarify.  If you requested that your care partner not be given the details of your procedure findings, then the procedure report has been included in a sealed envelope for you to review at your convenience later.  YOU SHOULD EXPECT: Some feelings of bloating in the abdomen. Passage of more gas than usual.  Walking can help get rid of the air that was put into your GI tract during the procedure and reduce the bloating. If you had a lower endoscopy (such as a colonoscopy or flexible sigmoidoscopy) you may notice spotting of blood in your stool or on the toilet paper. If you underwent a bowel prep for your procedure, you may not have a normal bowel movement for a few days.  Please Note:  You might notice some irritation and congestion in your nose or some drainage.  This is from the oxygen used during your procedure.  There is no need for concern and it should clear up in a day or so.       SYMPTOMS TO REPORT IMMEDIATELY:  Following lower endoscopy (colonoscopy or flexible sigmoidoscopy):  Excessive amounts of blood in the stool  Significant tenderness or worsening of abdominal pains  Swelling of the abdomen that is new, acute  Fever of 100F or higher  Following upper endoscopy (EGD)  Vomiting of blood or coffee ground material  New chest pain or pain under the shoulder blades  Painful or persistently difficult swallowing  New shortness of breath  Fever of 100F or higher  Black,  tarry-looking stools  For urgent or emergent issues, a gastroenterologist can be reached at any hour by calling [phone]. Do not use MyChart messaging for urgent concerns.    DIET:  We do recommend a small meal at first, but then you may proceed to your regular diet.  Drink plenty of fluids but you should avoid alcoholic beverages for 24 hours.  ACTIVITY:  You should plan to take it easy for the rest of today and you should NOT DRIVE or use heavy machinery until tomorrow (because of the sedation medicines used during the test).    FOLLOW UP: Our staff will call the number listed on your records 48-72 hours following your procedure to check on you and address any questions or concerns that you may have regarding the information given to you following your procedure. If we do not reach you, we will leave a message.  We will attempt to reach you two times.  During this call, we will ask if you have developed any symptoms of COVID 19. If you develop any symptoms (ie: fever, flu-like symptoms, shortness of breath, cough etc.) before then, please call [phone].  If you test positive for Covid 19 in the 2 weeks post procedure, please call and report this information to [place].    If any biopsies were taken you will be contacted by phone or by letter within the next 1-3 weeks.  Please call us at [phone] if you have  not heard about the biopsies in 3 weeks.    SIGNATURES/CONFIDENTIALITY: You and/or your care partner have signed paperwork which will be entered into your electronic medical record.  These signatures attest to the fact that that the information above on your After Visit Summary has been reviewed and is understood.  Full responsibility of the confidentiality of this discharge information lies with you and/or your care-partner.

## 2021-06-28 NOTE — Op Note (Signed)
[facility] Patient Name: Trevor Johnson Procedure Date: 06/28/2021 10:57 AM MRN: [id number] Endoscopist: Remo Lipps P. Havery Moros , MD Age: 60 Referring MD:  Date of Birth: [birth year] Gender: Male Account #: [id number] Procedure:                Flexible Sigmoidoscopy Indications:              High risk colon cancer surveillance: Personal                            history of malignant colon polyp removed sigmoid                            colon 01/2021 with clear margins. Here for close                            surveillance exam (CT imaging negative, patient has                            opted for close surveillance endoscopically and                            declined surgery - discussed at multi-D conference) Medicines:                Monitored Anesthesia Care Procedure:                Pre-Anesthesia Assessment:                           - Prior to the procedure, a History and Physical                            was performed, and patient medications and                            allergies were reviewed. The patient's tolerance of                            previous anesthesia was also reviewed. The risks                            and benefits of the procedure and the sedation                            options and risks were discussed with the patient.                            All questions were answered, and informed consent                            was obtained. Prior Anticoagulants: The patient has                            taken no previous anticoagulant or antiplatelet  agents. ASA Grade Assessment: II - A patient with                            mild systemic disease. After reviewing the risks                            and benefits, the patient was deemed in                            satisfactory condition to undergo the procedure.                           After obtaining informed consent, the scope was                             passed under direct vision. The GIF [id number]                            was introduced through the anus and advanced to the                            the descending colon. The flexible sigmoidoscopy                            was accomplished without difficulty. The patient                            tolerated the procedure well. The quality of the                            bowel preparation was adequate. Scope In: Scope Out: Findings:                 The perianal and digital rectal examinations were                            normal.                           A few small-mouthed diverticula were found in the                            sigmoid colon.                           A medium post polypectomy scar was found in the                            sigmoid colon in close approximation to a tattoo.                            There was no evidence of the previous polyp.                            Biopsies were  taken with a cold forceps for                            histology.                           Internal hemorrhoids were found during retroflexion.                           The exam was otherwise without abnormality. Complications:            No immediate complications. Estimated blood loss:                            Minimal. Estimated Blood Loss:     Estimated blood loss was minimal. Impression:               - Diverticulosis in the sigmoid colon.                           - Post-polypectomy scar in the sigmoid colon near                            prior tattoo - well healed without residual /                            recurrent overt polypoid tissue. Biopsied.                           - Internal hemorrhoids.                           - The examination was otherwise normal. Recommendation:           - Discharge patient to home.                           - Resume previous diet.                           - Continue present medications.                           - Await  pathology results. Remo Lipps P. Ziggy Reveles, MD 06/28/2021 11:21:54 AM This report has been signed electronically.

## 2021-06-28 NOTE — Progress Notes (Signed)
Pt Drowsy. VSS. To PACU, report to RN. No anesthetic complications noted.  

## 2021-06-28 NOTE — Progress Notes (Signed)
[facility] History and Physical   Primary Care Physician:  Denita Lung, MD   Reason for Procedure:   History of malignant colon polyp, cirrhosis rule out varices  Plan:    Flex sig + EGD     HPI: Trevor Johnson is a 60 y.o. male  here for surveillance of malignant sigmoid colon polyp removed in May. Thought to have been completely removed endoscopically, here for close surveillance follow up after multiD meeting from [place].. Patient denies any bowel symptoms at this time. No family history of colon cancer known. Otherwise feels well without any cardiopulmonary symptoms. He has imaging of his liver suggestive of cirrhosis, here for screening EGD as well.    Past Medical History:  Diagnosis Date   Allergy    RHINITIS   Cirrhosis of liver ([facility] [place]) 02/2021   Colon cancer ([facility]) 01/2021   malignant colon polyp (sigmoid colon)   Diabetes mellitus    Fatty liver    Hypertension    Hypertriglyceridemia    Obesity    Renal stone     Past Surgical History:  Procedure Laterality Date   COLONOSCOPY     URETHRAL STRICTURE DILATATION      Prior to Admission medications   Medication Sig Start Date End Date Taking? Authorizing Provider  icosapent Ethyl (VASCEPA) 1 g capsule Take 2 capsules (2 g total) by mouth 2 (two) times daily. 11/17/20  Yes Denita Lung, MD  JARDIANCE 10 MG TABS tablet TAKE 1 TABLET BY MOUTH EVERY DAY 05/16/21  Yes Denita Lung, MD  lisinopril-hydrochlorothiazide (ZESTORETIC) 10-12.5 MG tablet TAKE 1 TABLET BY MOUTH EVERY DAY 11/17/20  Yes Denita Lung, MD  sildenafil (REVATIO) 20 MG tablet Take up to 5 pills as needed daily 09/27/18  Yes Denita Lung, MD  ezetimibe (ZETIA) 10 MG tablet Take 1 tablet (10 mg total) by mouth daily. 11/25/20   Denita Lung, MD    Current Outpatient Medications  Medication Sig Dispense Refill   icosapent Ethyl (VASCEPA) 1 g capsule Take 2 capsules (2 g total) by mouth 2 (two) times daily. 180 capsule  3   JARDIANCE 10 MG TABS tablet TAKE 1 TABLET BY MOUTH EVERY DAY 90 tablet 1   lisinopril-hydrochlorothiazide (ZESTORETIC) 10-12.5 MG tablet TAKE 1 TABLET BY MOUTH EVERY DAY 90 tablet 3   sildenafil (REVATIO) 20 MG tablet Take up to 5 pills as needed daily 30 tablet 2   ezetimibe (ZETIA) 10 MG tablet Take 1 tablet (10 mg total) by mouth daily. 90 tablet 3   Current Facility-Administered Medications  Medication Dose Route Frequency Provider Last Rate Last Admin   0.9 %  sodium chloride infusion  500 mL Intravenous Once Laycie Schriner, Carlota Raspberry, MD        Allergies as of 06/28/2021 - Review Complete 06/28/2021  Allergen Reaction Noted   Lipitor [atorvastatin] Itching 05/01/2014   Crestor [rosuvastatin calcium] Rash 11/15/2015    Family History  Problem Relation Age of Onset   Colon cancer Maternal Grandfather    Colon polyps Neg Hx    Esophageal cancer Neg Hx    Rectal cancer Neg Hx    Stomach cancer Neg Hx     Social History   Socioeconomic History   Marital status: Married    Spouse name: Not on file   Number of children: Not on file   Years of education: Not on file   Highest education level: Not on file  Occupational History  Not on file  Tobacco Use   Smoking status: Never   Smokeless tobacco: Never  Vaping Use   Vaping Use: Never used  Substance and Sexual Activity   Alcohol use: No   Drug use: No   Sexual activity: Yes    Birth control/protection: Surgical  Other Topics Concern   Not on file  Social History Narrative   Not on file   Social Determinants of Health   Financial Resource Strain: Not on file  Food Insecurity: Not on file  Transportation Needs: Not on file  Physical Activity: Not on file  Stress: Not on file  Social Connections: Not on file  Intimate Partner Violence: Not on file    Review of Systems: All other review of systems negative except as mentioned in the HPI.  Physical Exam: Vital signs BP (!) 132/101   Pulse 74   Temp 98.2 F  (36.8 C) (Temporal)   Resp 18   Ht 5\' 11"  (1.803 m)   Wt 260 lb (117.9 kg)   SpO2 96%   BMI 36.26 kg/m   General:   Alert,  Well-developed,  pleasant and cooperative in NAD Lungs:  Clear throughout to auscultation.   Heart:  Regular rate and rhythm Abdomen:  Soft, nontender and nondistended.   Neuro/Psych:  Alert and cooperative. Normal mood and affect. A and O x 3  Jolly Mango, MD [facility]

## 2021-06-28 NOTE — Progress Notes (Signed)
VS- Trevor Johnson 

## 2021-06-28 NOTE — Progress Notes (Signed)
Called to room to assist during endoscopic procedure.  Patient ID and intended procedure confirmed with present staff. Received instructions for my participation in the procedure from the performing physician.  

## 2021-06-28 NOTE — Op Note (Signed)
[facility] Patient Name: Suraj Ramdass Procedure Date: 06/28/2021 10:57 AM MRN: [id number] Endoscopist: Remo Lipps P. Havery Moros , MD Age: 60 Referring MD:  Date of Birth: 1960/10/21 Gender: Male Account #: [id number] Procedure:                Upper GI endoscopy Indications:              Suspected cirrhosis based on imaging - rule out                            esophageal varices Medicines:                Monitored Anesthesia Care Procedure:                Pre-Anesthesia Assessment:                           - Prior to the procedure, a History and Physical                            was performed, and patient medications and                            allergies were reviewed. The patient's tolerance of                            previous anesthesia was also reviewed. The risks                            and benefits of the procedure and the sedation                            options and risks were discussed with the patient.                            All questions were answered, and informed consent                            was obtained. Prior Anticoagulants: The patient has                            taken no previous anticoagulant or antiplatelet                            agents. ASA Grade Assessment: II - A patient with                            mild systemic disease. After reviewing the risks                            and benefits, the patient was deemed in                            satisfactory condition to undergo the procedure.  After obtaining informed consent, the endoscope was                            passed under direct vision. Throughout the                            procedure, the patient's blood pressure, pulse, and                            oxygen saturations were monitored continuously. The                            GIF [id number] was introduced through the                            mouth, and advanced to the second  part of duodenum.                            The upper GI endoscopy was accomplished without                            difficulty. The patient tolerated the procedure                            well. Scope In: Scope Out: Findings:                 Esophagogastric landmarks were identified: the                            Z-line was found at 40 cm, the gastroesophageal                            junction was found at 40 cm and the upper extent of                            the gastric folds was found at 40 cm from the                            incisors.                           A single nodule vs. hypertrophied fold was found at                            the gastroesophageal junction with focal                            ulceration. Biopsies were taken with a cold forceps                            for histology to rule out adenomatous change.                           LA  Grade A esophagitis was found in the distal                            esophagus.                           The exam of the esophagus was otherwise normal. No                            esophageal varices.                           The entire examined stomach was normal. No gastric                            varices                           The duodenal bulb and second portion of the                            duodenum were normal. Complications:            No immediate complications. Estimated blood loss:                            Minimal. Estimated Blood Loss:     Estimated blood loss was minimal. Impression:               - Esophagogastric landmarks identified.                           - Nodule vs. hypertrophied fold found in the GEJ                            with focal ulceration. Biopsied.                           - LA Grade A esophagitis.                           - No varices noted anywhere                           - Normal stomach.                           - Normal duodenal bulb and second portion of  the                            duodenum. Recommendation:           - Patient has a contact number available for                            emergencies. The signs and symptoms of potential  delayed complications were discussed with the                            patient. Return to normal activities tomorrow.                            Written discharge instructions were provided to the                            patient.                           - Resume previous diet.                           - Continue present medications.                           - Start omeprazole 20mg  / day                           - Await pathology results with further                            recommendations Remo Lipps P. Aren Cherne, MD 06/28/2021 11:26:10 AM This report has been signed electronically.

## 2021-06-30 ENCOUNTER — Telehealth: Payer: Self-pay | Admitting: *Deleted

## 2021-06-30 NOTE — Telephone Encounter (Signed)
  Follow up Call-  Call back number 06/28/2021 02/08/2021  Post procedure Call Back phone  # [phone]  Permission to leave phone message Yes Yes  Some recent data might be hidden     Patient questions:  Do you have a fever, pain , or abdominal swelling? No. Pain Score  0 *  Have you tolerated food without any problems? Yes.    Have you been able to return to your normal activities? Yes.    Do you have any questions about your discharge instructions: Diet   No. Medications  No. Follow up visit  No.  Do you have questions or concerns about your Care? No.  Actions: * If pain score is 4 or above: No action needed, pain <4.  Have you developed a fever since your procedure? no  2.   Have you had an respiratory symptoms (SOB or cough) since your procedure? no  3.   Have you tested positive for COVID 19 since your procedure no  4.   Have you had any family members/close contacts diagnosed with the COVID 19 since your procedure?  no   If yes to any of these questions please route to Joylene John, RN and Joella Prince, RN

## 2021-07-05 ENCOUNTER — Other Ambulatory Visit: Payer: Self-pay

## 2021-07-05 ENCOUNTER — Other Ambulatory Visit: Payer: Self-pay | Admitting: Gastroenterology

## 2021-07-05 DIAGNOSIS — D509 Iron deficiency anemia, unspecified: Secondary | ICD-10-CM

## 2021-07-05 DIAGNOSIS — R7989 Other specified abnormal findings of blood chemistry: Secondary | ICD-10-CM

## 2021-07-05 MED ORDER — OMEPRAZOLE 40 MG PO CPDR: 40.0000 mg | DELAYED_RELEASE_CAPSULE | Freq: Two times a day (BID) | ORAL | 2 refills | Status: DC

## 2021-07-05 MED ORDER — SUCRALFATE 1 GM/10ML PO SUSP: 1.0000 g | Freq: Two times a day (BID) | ORAL | 4 refills | Status: DC

## 2021-07-06 ENCOUNTER — Other Ambulatory Visit: Payer: Self-pay

## 2021-07-06 MED ORDER — SUCRALFATE 1 G PO TABS: 1.0000 g | ORAL_TABLET | Freq: Two times a day (BID) | ORAL | 2 refills | Status: DC

## 2021-07-07 ENCOUNTER — Other Ambulatory Visit (INDEPENDENT_AMBULATORY_CARE_PROVIDER_SITE_OTHER): Payer: 59

## 2021-07-07 DIAGNOSIS — D509 Iron deficiency anemia, unspecified: Secondary | ICD-10-CM | POA: Diagnosis not present

## 2021-07-07 DIAGNOSIS — R7989 Other specified abnormal findings of blood chemistry: Secondary | ICD-10-CM | POA: Diagnosis not present

## 2021-07-07 LAB — CBC WITH DIFFERENTIAL/PLATELET
Basophils Absolute: 0 10*3/uL (ref 0.0–0.1)
Basophils Relative: 0.9 % (ref 0.0–3.0)
Eosinophils Absolute: 0.1 10*3/uL (ref 0.0–0.7)
Eosinophils Relative: 2.7 % (ref 0.0–5.0)
HCT: 46.8 % (ref 39.0–52.0)
Hemoglobin: 16.3 g/dL (ref 13.0–17.0)
Lymphocytes Relative: 36.7 % (ref 12.0–46.0)
Lymphs Abs: 1.5 10*3/uL (ref 0.7–4.0)
MCHC: 34.9 g/dL (ref 30.0–36.0)
MCV: 95.2 fl (ref 78.0–100.0)
Monocytes Absolute: 0.4 10*3/uL (ref 0.1–1.0)
Monocytes Relative: 10.8 % (ref 3.0–12.0)
Neutro Abs: 2 10*3/uL (ref 1.4–7.7)
Neutrophils Relative %: 48.9 % (ref 43.0–77.0)
Platelets: 158 10*3/uL (ref 150.0–400.0)
RBC: 4.91 Mil/uL (ref 4.22–5.81)
RDW: 13.7 % (ref 11.5–15.5)
WBC: 4.1 10*3/uL (ref 4.0–10.5)

## 2021-07-08 LAB — IRON,TIBC AND FERRITIN PANEL
%SAT: 51 % (calc) — ABNORMAL HIGH (ref 20–48)
Ferritin: 405 ng/mL — ABNORMAL HIGH (ref 38–380)
Iron: 158 ug/dL (ref 50–180)
TIBC: 312 mcg/dL (calc) (ref 250–425)

## 2021-08-10 ENCOUNTER — Telehealth: Payer: Self-pay

## 2021-08-10 NOTE — Telephone Encounter (Signed)
-----   Message from Roetta Sessions, [place] sent at 07/11/2021  1:50 PM EDT ----- Regarding: OV January Armbruster, Steven P, MD  Roetta Sessions, CMA On 07-11-21: Jan can you please help coordinate routine office follow up in 3 months (January)? Thanks

## 2021-08-10 NOTE — Telephone Encounter (Signed)
Called patient and left detailed message that Dr. Doyne Keel January schedule is now available and we can get him scheduled for an office visit.  Advised patient to call or MyChart to get scheduled for 3 month recall appointment (see recall).

## 2021-08-25 ENCOUNTER — Other Ambulatory Visit: Payer: Self-pay

## 2021-08-25 ENCOUNTER — Telehealth: Payer: Self-pay

## 2021-08-25 DIAGNOSIS — K7460 Unspecified cirrhosis of liver: Secondary | ICD-10-CM

## 2021-08-25 NOTE — Telephone Encounter (Signed)
LM for patient that he is due for an appointment with Dr. Havery Moros in January and a 3rd and final Twinrix on or after December 14th. Asked that he call to schedule.

## 2021-08-25 NOTE — Telephone Encounter (Signed)
Sent MyChart message to patient. He is due for multiple appts

## 2021-08-25 NOTE — Telephone Encounter (Signed)
-----   Message from Aleatha Borer, LPN sent at 03/28/8888  1:40 PM EDT ----- Regarding: 6 month Twinrix injection Pt will be due for 6 month Twinrix injection on or after 10/08/21. Will need to call to schedule

## 2021-08-26 ENCOUNTER — Other Ambulatory Visit: Payer: Self-pay

## 2021-08-27 ENCOUNTER — Other Ambulatory Visit: Payer: Self-pay

## 2021-09-08 ENCOUNTER — Ambulatory Visit (INDEPENDENT_AMBULATORY_CARE_PROVIDER_SITE_OTHER): Payer: 59 | Admitting: Gastroenterology

## 2021-09-08 ENCOUNTER — Other Ambulatory Visit: Payer: 59

## 2021-09-08 DIAGNOSIS — Z23 Encounter for immunization: Secondary | ICD-10-CM

## 2021-09-08 DIAGNOSIS — K7460 Unspecified cirrhosis of liver: Secondary | ICD-10-CM

## 2021-09-09 LAB — AFP TUMOR MARKER: AFP-Tumor Marker: 3.7 ng/mL (ref <6.1)

## 2021-09-12 ENCOUNTER — Ambulatory Visit (HOSPITAL_COMMUNITY)
Admission: RE | Admit: 2021-09-12 | Discharge: 2021-09-12 | Disposition: A | Discharge status: Home / Self Care | Payer: 59 | Source: Ambulatory Visit | Attending: Gastroenterology | Admitting: Gastroenterology

## 2021-09-12 ENCOUNTER — Other Ambulatory Visit: Payer: Self-pay

## 2021-09-12 DIAGNOSIS — K7460 Unspecified cirrhosis of liver: Secondary | ICD-10-CM

## 2021-10-07 ENCOUNTER — Other Ambulatory Visit: Payer: Self-pay | Admitting: Family Medicine

## 2021-10-07 DIAGNOSIS — E1169 Type 2 diabetes mellitus with other specified complication: Secondary | ICD-10-CM

## 2021-10-20 ENCOUNTER — Encounter: Payer: Self-pay | Admitting: Gastroenterology

## 2021-10-20 ENCOUNTER — Other Ambulatory Visit (INDEPENDENT_AMBULATORY_CARE_PROVIDER_SITE_OTHER): Payer: 59

## 2021-10-20 ENCOUNTER — Ambulatory Visit (INDEPENDENT_AMBULATORY_CARE_PROVIDER_SITE_OTHER): Payer: 59 | Admitting: Gastroenterology

## 2021-10-20 VITALS — BP 110/74 | HR 72 | Ht 72.0 in | Wt 256.2 lb

## 2021-10-20 DIAGNOSIS — R21 Rash and other nonspecific skin eruption: Secondary | ICD-10-CM | POA: Diagnosis not present

## 2021-10-20 DIAGNOSIS — K7460 Unspecified cirrhosis of liver: Secondary | ICD-10-CM

## 2021-10-20 DIAGNOSIS — R933 Abnormal findings on diagnostic imaging of other parts of digestive tract: Secondary | ICD-10-CM | POA: Diagnosis not present

## 2021-10-20 DIAGNOSIS — Z86010 Personal history of colonic polyps: Secondary | ICD-10-CM | POA: Diagnosis not present

## 2021-10-20 DIAGNOSIS — R1011 Right upper quadrant pain: Secondary | ICD-10-CM

## 2021-10-20 LAB — HEPATIC FUNCTION PANEL
ALT: 72 U/L — ABNORMAL HIGH (ref 0–53)
AST: 45 U/L — ABNORMAL HIGH (ref 0–37)
Albumin: 4.4 g/dL (ref 3.5–5.2)
Alkaline Phosphatase: 79 U/L (ref 39–117)
Bilirubin, Direct: 0.1 mg/dL (ref 0.0–0.3)
Total Bilirubin: 1 mg/dL (ref 0.2–1.2)
Total Protein: 7.6 g/dL (ref 6.0–8.3)

## 2021-10-20 LAB — CBC WITH DIFFERENTIAL/PLATELET
Basophils Absolute: 0 10*3/uL (ref 0.0–0.1)
Basophils Relative: 0.6 % (ref 0.0–3.0)
Eosinophils Absolute: 0.2 10*3/uL (ref 0.0–0.7)
Eosinophils Relative: 4.3 % (ref 0.0–5.0)
HCT: 46.8 % (ref 39.0–52.0)
Hemoglobin: 16.4 g/dL (ref 13.0–17.0)
Lymphocytes Relative: 33.8 % (ref 12.0–46.0)
Lymphs Abs: 1.5 10*3/uL (ref 0.7–4.0)
MCHC: 35 g/dL (ref 30.0–36.0)
MCV: 95.6 fl (ref 78.0–100.0)
Monocytes Absolute: 0.4 10*3/uL (ref 0.1–1.0)
Monocytes Relative: 9.3 % (ref 3.0–12.0)
Neutro Abs: 2.3 10*3/uL (ref 1.4–7.7)
Neutrophils Relative %: 52 % (ref 43.0–77.0)
Platelets: 157 10*3/uL (ref 150.0–400.0)
RBC: 4.9 Mil/uL (ref 4.22–5.81)
RDW: 13.6 % (ref 11.5–15.5)
WBC: 4.4 10*3/uL (ref 4.0–10.5)

## 2021-10-20 LAB — PROTIME-INR
INR: 1 ratio (ref 0.8–1.0)
Prothrombin Time: 11.1 s (ref 9.6–13.1)

## 2021-10-20 NOTE — Patient Instructions (Addendum)
If you are age 59 or older, your body mass index should be between 23-30. Your Body mass index is 34.75 kg/m. If this is out of the aforementioned range listed, please consider follow up with your Primary Care Provider.  If you are age 24 or younger, your body mass index should be between 19-25. Your Body mass index is 34.75 kg/m. If this is out of the aformentioned range listed, please consider follow up with your Primary Care Provider.   ________________________________________________________  The [facility] providers would like to encourage you to use [facility] to communicate with providers for non-urgent requests or questions.  Due to long hold times on the telephone, sending your provider a message by [facility] may be a faster and more efficient way to get a response.  Please allow 48 business hours for a response.  Please remember that this is for non-urgent requests.  _______________________________________________________  Please go to the lab in the basement of our building to have lab work done as you leave today. Hit "B" for basement when you get on the elevator.  When the doors open the lab is on your left.  We will call you with the results. Thank you.  You have been scheduled for an endoscopy. Please follow written instructions given to you at your visit today. If you use inhalers (even only as needed), please bring them with you on the day of your procedure.  Continue omeprazole.  You will be due for an RUQ Ultrasound in June and a colonoscopy in October 2023. We will remind you when it is time to schedule.  You can try over-the-counter hydrocortisone cream for your rash and follow up with your PCP.  Please follow up in the office in 6 months.  Thank you for entrusting me with your care and for choosing [facility], Dr. [place]

## 2021-10-20 NOTE — Progress Notes (Signed)
HPI :  61 year old male here for a follow-up visit for malignant colon polyp and cirrhosis, as well as to discuss recent EGD findings.   Recall that he saw me on Feb 08 2021 for routine screening colonoscopy.  He had 9 polyps removed, largest 12 mm.  He had an 8 mm irregular appearing polyp in the sigmoid colon which was pedunculated.  This was removed with a hot snare and tattooed.  Pathology results showed invasive moderately differentiated adenocarcinoma with invasion into the submucosa of the stalk with a clear margin of only 1 mm.  He underwent staging CT which showed no evidence of metastatic disease.  CEA was negative.  His case was referred to the cancer conference for discussion to determine if he would warrant colorectal surgery involvement versus surveillance colonoscopy.  He elected for close surveillance colonoscopy. His grandfather had colon cancer but no other colon cancer in the family is aware of  On October 4th 2022 he had a surveillance flex sig with me and the polypectomy site looked good, no recurrent disease or polypoid tissue. We recommended a surveillance colonoscopy in one year. He denies any bowel changes and doing well in this regard.   Otherwise recall that on his staging CT he incidentally was noted to have diffuse fatty liver and some early changes of cirrhosis.  Spleen was normal.  Platelets normal.  He states he was told he had fatty liver a long time ago.  He has had mild elevation in his liver enzymes, ALT to 50-60s over the past few years.  Weight has been up to 300 pounds in the past, but has fluctuated, more recently has been around 250 to 260 pounds.  He denies any significant alcohol use.  Denies any history of long-term steroids.  He takes multiple medications for hyperlipidemia including fenofibrate which has been on for 6 months, but was stopped after this diagnosis.  Otherwise he had a lab work-up done for chronic liver diseases, he had an elevated ferritin which  led to hemochromatosis genetic testing which was negative. He was not immune to hepatitis A or B and was vaccinated to those. EGD was done in October for varices screening. He fortunately did not have any varices noted but had an ulcerated nodule at the GEJ. Biopsies showed hyperplastic polyp. He was placed on omeprazole 40mg  / daily, I had discussed his case with my advanced endoscopy colleagues, we recommended a repeat EGD in a few months to check for mucosal healing and to see if this lesion warranted removal. He denies any dysphagia. He denies any reflux symptoms. He generally is feeling well without any upper tract complaints. From a cirrhosis standpoint he has been compensated. No edema, no jaundice. Weight stable since I have last seen him. WE had discussed using carafate as well to treat the ulcerated nodule but he did not like taking that.   Otherwise inquires about a rash he has noted around his umbilicus, chest, and back. Red rash, with some mild joint arthralgias as well which are fairly recent. HE otherwise inquires about some RUQ pain he has had recently. Tender to touch, worse with movements or coughing. He recently had a RUQ [place] for [facility] screening which did not show any concerning pathology and stable changes of cirrhosis.    Prior work-up: Colonoscopy 02/08/21 - One 12 mm polyp in the cecum, removed with a cold snare. Resected and retrieved. - One 3 mm polyp in the ascending colon, removed with a cold snare.  Resected and retrieved. - One 4 mm polyp at the hepatic flexure, removed with a cold snare. Resected and retrieved. - Medium-sized lipoma in the ascending colon. - Three 3 to 5 mm polyps in the transverse colon, removed with a cold snare. Resected and retrieved. - Diverticulosis in the transverse colon and in the left colon. - One 5 mm polyp in the descending colon, removed with a cold snare. Resected and retrieved. - One 12 mm polyp in the descending colon, removed with a cold  snare. Resected and retrieved. - One 8 mm polyp in the sigmoid colon, removed with a hot snare. Resected and retrieved. Tattooed. - Internal hemorrhoids. - There was significant looping of the colon. - The examination was otherwise normal.   1. Surgical [P], colon, transverse, ascending, hepatic flexure, descending, polyp (6) - TUBULAR ADENOMA(S) WITHOUT HIGH-GRADE DYSPLASIA OR MALIGNANCY - HYPERPLASTIC POLYP 2. Surgical [P], colon, cecum, polyp (1) - SESSILE SERRATED POLYP(S) WITHOUT CYTOLOGIC DYSPLASIA 3. Surgical [P], colon, descending, polyp (1) - TUBULAR ADENOMA(S) WITHOUT HIGH-GRADE DYSPLASIA OR MALIGNANCY 4. Surgical [P], colon, sigmoid, polyp (1) - INVASIVE MODERATELY DIFFERENTIATED ADENOCARCINOMA, 0.7 CM ARISING IN A TUBULAR ADENOMA WITH HIGH-GRADE DYSPLASIA (MALIGNANT POLYP) - CARCINOMA INVADES INTO SUBMUCOSA FOR A DEPTH OF 0.7 CM - NEGATIVE FOR AREAS OF POOR DIFFERENTIATION/ HIGH GRADE TUMOR BUDDING - NO DEFINITE EVIDENCE OF LYMPHOVASCULAR INVASION - THE CAUTERIZED RESECTION MARGIN (POLYP BASE) IS 0.1 CM FROM THE CARCINOMA   CT C/A/P 02/16/21 - IMPRESSION: 1. No acute intrathoracic, abdominal, or pelvic pathology. No evidence of metastatic disease in the chest, abdomen, or pelvis. 2. Fatty liver with early changes of cirrhosis.     CEA 1.1   ANA (+) 1::320 IgG 1215 Smooth muscle antibody (-) Ferritin 337.3 Iron 109 Iron sat 31.1 Hemochromatosis genetic testing negative Hep B surface antigen (-) Hep B surface antibody (-) Hep C AB (-) Hep A total antibody (-) Alpha one antitrypsin 163   Flex sig 06/28/21: The perianal and digital rectal examinations were normal. - A few small-mouthed diverticula were found in the sigmoid colon. - A medium post polypectomy scar was found in the sigmoid colon in close approximation to a tattoo. There was no evidence of the previous polyp. Biopsies were taken with a cold forceps for histology. - Internal hemorrhoids were found  during retroflexion. - The exam was otherwise without abnormality.   EGD 06/28/21: - A single nodule vs. hypertrophied fold was found at the gastroesophageal junction with focal ulceration. Biopsies were taken with a cold forceps for histology to rule out adenomatous change. - LA Grade A esophagitis was found in the distal esophagus. - The exam of the esophagus was otherwise normal. No esophageal varices. - The entire examined stomach was normal. No gastric varices - The duodenal bulb and second portion of the duodenum were normal.   Started omeprazole daily:  Diagnosis 1. Surgical [P], GE junction nodule - HYPERPLASTIC GASTRIC POLYP(S) - NO H. PYLORI, INTESTINAL METAPLASIA OR MALIGNANCY IDENTIFIED 2. Surgical [P], colon, sigmoid polypectomy scar BX - BENIGN COLONIC MUCOSA - NO HIGH-GRADE DYSPLASIA OR MALIGNANCY IDENTIFIED   RUQ [place] 09/12/21: IMPRESSION: 1. Coarsened increased liver echotexture consistent with likely cirrhosis and hepatic steatosis. No focal parenchymal abnormalities  Past Medical History:  Diagnosis Date   Allergy    RHINITIS   Cirrhosis of liver ([place]) 02/2021   Colon cancer ([place]) 01/2021   malignant colon polyp (sigmoid colon)   Diabetes mellitus    Fatty liver    Hypertension  Hypertriglyceridemia    Obesity    Renal stone      Past Surgical History:  Procedure Laterality Date   COLONOSCOPY     URETHRAL STRICTURE DILATATION     Family History  Problem Relation Age of Onset   Colon cancer Maternal Grandfather    Colon polyps Neg Hx    Esophageal cancer Neg Hx    Rectal cancer Neg Hx    Stomach cancer Neg Hx    Social History   Tobacco Use   Smoking status: Never   Smokeless tobacco: Never  Vaping Use   Vaping Use: Never used  Substance Use Topics   Alcohol use: No   Drug use: No   Current Outpatient Medications  Medication Sig Dispense Refill   ezetimibe (ZETIA) 10 MG tablet TAKE 1 TABLET DAILY 90 tablet 1   icosapent Ethyl  (VASCEPA) 1 g capsule Take 2 capsules (2 g total) by mouth 2 (two) times daily. 180 capsule 3   JARDIANCE 10 MG TABS tablet TAKE 1 TABLET BY MOUTH EVERY DAY 90 tablet 1   lisinopril-hydrochlorothiazide (ZESTORETIC) 10-12.5 MG tablet TAKE 1 TABLET BY MOUTH EVERY DAY 90 tablet 3   omeprazole (PRILOSEC) 40 MG capsule Take 1 capsule (40 mg total) by mouth in the morning and at bedtime. 60 capsule 2   sildenafil (REVATIO) 20 MG tablet Take up to 5 pills as needed daily 30 tablet 2   No current facility-administered medications for this visit.   Allergies  Allergen Reactions   Lipitor [Atorvastatin] Itching   Crestor [Rosuvastatin Calcium] Rash     Review of Systems: All systems reviewed and negative except where noted in HPI.    Lab Results  Component Value Date   WBC 4.4 10/20/2021   HGB 16.4 10/20/2021   HCT 46.8 10/20/2021   MCV 95.6 10/20/2021   PLT 157.0 10/20/2021    Lab Results  Component Value Date   CREATININE 1.02 02/16/2021   BUN 18 02/16/2021   NA 134 (L) 02/16/2021   K 4.1 02/16/2021   CL 101 02/16/2021   CO2 23 02/16/2021    Lab Results  Component Value Date   ALT 72 (H) 10/20/2021   AST 45 (H) 10/20/2021   ALKPHOS 79 10/20/2021   BILITOT 1.0 10/20/2021    Lab Results  Component Value Date   INR 1.0 10/20/2021     Physical Exam: BP 110/74    Pulse 72    Ht 6' (1.829 m)    Wt 256 lb 4 oz (116.2 kg)    BMI 34.75 kg/m  Constitutional: Pleasant,well-developed, male in no acute distress. Abdominal: Soft, nondistended, nontender. . There are no masses palpable. . Neurological: Alert and oriented to person place and time. Skin: Skin is warm and dry. Red dry macular rash on chest, mid back and umbilicus Psychiatric: Normal mood and affect. Behavior is normal.   ASSESSMENT AND PLAN: 61 y/o male here for reassessment of the following:  Cirrhosis - likely due to fatty liver History of malignant colon polyp Esophageal nodule / ulceration Rash RUQ  Pain  Cirrhosis incidentally noted on CT imaging, is compensated, likely due to fatty liver. He is now started immunization to hep A / B. No varices. Labs stable. He would benefit from weight loss, has been referred to weight loss clinic and he will follow through with that. Discussed risks for decompensation and HCC moving forward, will see him in 6 months, due for surveillance [place] in June.  Malignant colon  polyp with short interval flex sig shows no recurrence. Planning colonoscopy one year from last exam. Asymptomatic.   Esophageal nodule likely inflammatory / benign hyperplastic, now on omeprazole. Discussed doing a follow up EGD to assess for mucosal healing and seeing if this warrants removal. Discussed risks / benefits he wishes to proceed. Continue omeprazole for now.   Rash noted recently as above. Not typical appearance for eczema or psoriasis overtly but possible, not sure if drug reaction possible. Recommend topical hydrocortizone cream and see his PCP in the near future. Otherwise his RUQ discomfort seems musculoskeletal, recent imaging looks okay, he will monitor, use tylenol or topical muscle rubs.  Plan: - EGD at [facility] - continue omeprazole for now - LFTs, CBC, INR today and stable - work on weight loss - [facility] - recall [place] in 02/2022 - see PCP about rash - try hydrocortizone cream OTC - recall colonoscopy October 2023 - f/u 6 months  Jolly Mango, MD [facility] Gastroenterology

## 2021-10-24 ENCOUNTER — Ambulatory Visit (AMBULATORY_SURGERY_CENTER): Payer: 59 | Admitting: Gastroenterology

## 2021-10-24 ENCOUNTER — Encounter: Payer: Self-pay | Admitting: Gastroenterology

## 2021-10-24 ENCOUNTER — Other Ambulatory Visit: Payer: Self-pay

## 2021-10-24 VITALS — BP 122/72 | HR 78 | Temp 96.4°F | Resp 18 | Ht 72.0 in | Wt 215.0 lb

## 2021-10-24 DIAGNOSIS — K229 Disease of esophagus, unspecified: Secondary | ICD-10-CM

## 2021-10-24 MED ORDER — SODIUM CHLORIDE 0.9 % IV SOLN: 500.0000 mL | Freq: Once | INTRAVENOUS | Status: DC

## 2021-10-24 NOTE — Progress Notes (Signed)
History and Physical Interval Note: Seen on 10/20/21 - no interval changes. Here for EGD to evaluate esophageal / GEJ nodule, on PPI. Feeling well today without complaints.  10/24/2021 10:15 AM  Trevor Johnson  has presented today for endoscopic procedure(s), with the diagnosis of  Encounter Diagnosis  Name Primary?   Nodule of esophagus Yes  .  The various methods of evaluation and treatment have been discussed with the patient and/or family. After consideration of risks, benefits and other options for treatment, the patient has consented to  the endoscopic procedure(s).   The patient's history has been reviewed, patient examined, no change in status, stable for surgery.  I have reviewed the patient's chart and labs.  Questions were answered to the patient's satisfaction.    Jolly Mango, MD [facility] Gastroenterology

## 2021-10-24 NOTE — Progress Notes (Signed)
Pt's states no medical or surgical changes since previsit or office visit.  ° °VS DT °

## 2021-10-24 NOTE — Op Note (Signed)
[facility] Patient Name: Trevor Johnson Procedure Date: 10/24/2021 10:08 AM MRN: [id number] Endoscopist: Remo Lipps P. Garan Frappier , MD Age: 61 Referring MD:  Date of Birth: 10-21-1960 Gender: Male Account #: [id number] Procedure:                Upper GI endoscopy Indications:              Surveillance procedure, Personal history of upper                            GI endoscopy, follow up GEJ nodule with ulceration                            on EGD in 06/2021 in the setting of LA grade A                            esophagitis - biopsies showed hyperplastic changes,                            now on omeprazole. History of cirrhosis. Medicines:                Monitored Anesthesia Care Procedure:                Pre-Anesthesia Assessment:                           - Prior to the procedure, a History and Physical                            was performed, and patient medications and                            allergies were reviewed. The patient's tolerance of                            previous anesthesia was also reviewed. The risks                            and benefits of the procedure and the sedation                            options and risks were discussed with the patient.                            All questions were answered, and informed consent                            was obtained. Prior Anticoagulants: The patient has                            taken no previous anticoagulant or antiplatelet                            agents. ASA Grade Assessment: II - A patient with  mild systemic disease. After reviewing the risks                            and benefits, the patient was deemed in                            satisfactory condition to undergo the procedure.                           After obtaining informed consent, the endoscope was                            passed under direct vision. Throughout the                            procedure,  the patient's blood pressure, pulse, and                            oxygen saturations were monitored continuously. The                            GIF [id number] #[id number] was introduced through the                            mouth, and advanced to the second part of duodenum.                            The upper GI endoscopy was accomplished without                            difficulty. The patient tolerated the procedure                            well. Scope In: Scope Out: Findings:                 The Z-line was regular and was found 41 cm from the                            incisors. Previously noted "nodule" at the GEJ has                            intervally healed, no ulceration. No overt nodule                            but prominent fold in the area which appeared                            normal. Suspect changes likely inflammatory                            previously that healed with PPI.                           The  exam of the esophagus was otherwise normal. No                            varices                           The entire examined stomach was normal. No varices                           The duodenal bulb and second portion of the                            duodenum were normal. Complications:            No immediate complications. Estimated blood loss:                            None. Estimated Blood Loss:     Estimated blood loss: none. Impression:               - Z-line regular, 41 cm from the incisors.                           - Interval healing of ulcerated nodule /                            esophagitis on PPI                           - Normal stomach.                           - Normal duodenal bulb and second portion of the                            duodenum.                           Good response to PPI, no true nodule remains in the                            area, prior changes inflammatory / benign. Recommendation:           - Patient has a  contact number available for                            emergencies. The signs and symptoms of potential                            delayed complications were discussed with the                            patient. Return to normal activities tomorrow.                            Written discharge instructions were provided to the  patient.                           - Resume previous diet.                           - Continue present medications.                           - Repeat upper endoscopy in 3 years for screening                            for varices given history of cirrhosis. Remo Lipps P. Arthur Aydelotte, MD 10/24/2021 10:36:08 AM This report has been signed electronically.

## 2021-10-24 NOTE — Patient Instructions (Signed)
Repeat upper endoscopy in 3 years for screening   YOU HAD AN ENDOSCOPIC PROCEDURE TODAY AT [place]:   Refer to the procedure report that was given to you for any specific questions about what was found during the examination.  If the procedure report does not answer your questions, please call your gastroenterologist to clarify.  If you requested that your care partner not be given the details of your procedure findings, then the procedure report has been included in a sealed envelope for you to review at your convenience later.  YOU SHOULD EXPECT: Some feelings of bloating in the abdomen. Passage of more gas than usual.  Walking can help get rid of the air that was put into your GI tract during the procedure and reduce the bloating. If you had a lower endoscopy (such as a colonoscopy or flexible sigmoidoscopy) you may notice spotting of blood in your stool or on the toilet paper. If you underwent a bowel prep for your procedure, you may not have a normal bowel movement for a few days.  Please Note:  You might notice some irritation and congestion in your nose or some drainage.  This is from the oxygen used during your procedure.  There is no need for concern and it should clear up in a day or so.  SYMPTOMS TO REPORT IMMEDIATELY:  Following upper endoscopy (EGD)  Vomiting of blood or coffee ground material  New chest pain or pain under the shoulder blades  Painful or persistently difficult swallowing  New shortness of breath  Fever of 100F or higher  Black, tarry-looking stools  For urgent or emergent issues, a gastroenterologist can be reached at any hour by calling [phone]. Do not use MyChart messaging for urgent concerns.    DIET:  We do recommend a small meal at first, but then you may proceed to your regular diet.  Drink plenty of fluids but you should avoid alcoholic beverages for 24 hours.  ACTIVITY:  You should plan to take it easy for the rest of today  and you should NOT DRIVE or use heavy machinery until tomorrow (because of the sedation medicines used during the test).    FOLLOW UP: Our staff will call the number listed on your records 48-72 hours following your procedure to check on you and address any questions or concerns that you may have regarding the information given to you following your procedure. If we do not reach you, we will leave a message.  We will attempt to reach you two times.  During this call, we will ask if you have developed any symptoms of COVID 19. If you develop any symptoms (ie: fever, flu-like symptoms, shortness of breath, cough etc.) before then, please call [phone].  If you test positive for Covid 19 in the 2 weeks post procedure, please call and report this information to [place].    If any biopsies were taken you will be contacted by phone or by letter within the next 1-3 weeks.  Please call us at [phone] if you have not heard about the biopsies in 3 weeks.    SIGNATURES/CONFIDENTIALITY: You and/or your care partner have signed paperwork which will be entered into your electronic medical record.  These signatures attest to the fact that that the information above on your After Visit Summary has been reviewed and is understood.  Full responsibility of the confidentiality of this discharge information lies with you and/or your care-partner.

## 2021-10-24 NOTE — Progress Notes (Signed)
Report to PACU, RN, vss, BBS= Clear.  

## 2021-10-26 ENCOUNTER — Telehealth: Payer: Self-pay

## 2021-10-26 NOTE — Telephone Encounter (Signed)
°  Follow up Call-  Call back number 10/24/2021 06/28/2021 02/08/2021  Post procedure Call Back phone  # [phone]  Permission to leave phone message Yes Yes Yes  Some recent data might be hidden     Patient questions:  Do you have a fever, pain , or abdominal swelling? No. Pain Score  0 *  Have you tolerated food without any problems? Yes.    Have you been able to return to your normal activities? Yes.    Do you have any questions about your discharge instructions: Diet   No. Medications  No. Follow up visit  No.  Do you have questions or concerns about your Care? No.  Actions: * If pain score is 4 or above: No action needed, pain <4.

## 2021-10-30 ENCOUNTER — Other Ambulatory Visit: Payer: Self-pay | Admitting: Gastroenterology

## 2021-11-14 ENCOUNTER — Other Ambulatory Visit (INDEPENDENT_AMBULATORY_CARE_PROVIDER_SITE_OTHER): Payer: 59

## 2021-11-14 ENCOUNTER — Other Ambulatory Visit: Payer: Self-pay

## 2021-11-14 DIAGNOSIS — Z23 Encounter for immunization: Secondary | ICD-10-CM

## 2021-11-23 ENCOUNTER — Other Ambulatory Visit: Payer: Self-pay | Admitting: Family Medicine

## 2021-11-23 DIAGNOSIS — I152 Hypertension secondary to endocrine disorders: Secondary | ICD-10-CM

## 2021-11-23 DIAGNOSIS — E1159 Type 2 diabetes mellitus with other circulatory complications: Secondary | ICD-10-CM

## 2021-11-23 DIAGNOSIS — E11319 Type 2 diabetes mellitus with unspecified diabetic retinopathy without macular edema: Secondary | ICD-10-CM

## 2022-02-08 ENCOUNTER — Other Ambulatory Visit: Payer: Self-pay | Admitting: Family Medicine

## 2022-02-08 DIAGNOSIS — E785 Hyperlipidemia, unspecified: Secondary | ICD-10-CM

## 2022-02-08 DIAGNOSIS — E781 Pure hyperglyceridemia: Secondary | ICD-10-CM

## 2022-02-20 ENCOUNTER — Other Ambulatory Visit: Payer: Self-pay | Admitting: Family Medicine

## 2022-02-20 DIAGNOSIS — E1159 Type 2 diabetes mellitus with other circulatory complications: Secondary | ICD-10-CM

## 2022-02-20 DIAGNOSIS — E11319 Type 2 diabetes mellitus with unspecified diabetic retinopathy without macular edema: Secondary | ICD-10-CM

## 2022-02-22 ENCOUNTER — Encounter: Payer: Self-pay | Admitting: Family Medicine

## 2022-02-22 ENCOUNTER — Ambulatory Visit (INDEPENDENT_AMBULATORY_CARE_PROVIDER_SITE_OTHER): Payer: 59 | Admitting: Family Medicine

## 2022-02-22 VITALS — BP 106/60 | HR 81 | Temp 97.9°F | Wt 245.6 lb

## 2022-02-22 DIAGNOSIS — D126 Benign neoplasm of colon, unspecified: Secondary | ICD-10-CM

## 2022-02-22 DIAGNOSIS — E11319 Type 2 diabetes mellitus with unspecified diabetic retinopathy without macular edema: Secondary | ICD-10-CM

## 2022-02-22 DIAGNOSIS — R079 Chest pain, unspecified: Secondary | ICD-10-CM

## 2022-02-22 DIAGNOSIS — K7460 Unspecified cirrhosis of liver: Secondary | ICD-10-CM

## 2022-02-22 DIAGNOSIS — I152 Hypertension secondary to endocrine disorders: Secondary | ICD-10-CM

## 2022-02-22 DIAGNOSIS — E1169 Type 2 diabetes mellitus with other specified complication: Secondary | ICD-10-CM

## 2022-02-22 DIAGNOSIS — T383X5D Adverse effect of insulin and oral hypoglycemic [antidiabetic] drugs, subsequent encounter: Secondary | ICD-10-CM

## 2022-02-22 DIAGNOSIS — E669 Obesity, unspecified: Secondary | ICD-10-CM

## 2022-02-22 DIAGNOSIS — E1159 Type 2 diabetes mellitus with other circulatory complications: Secondary | ICD-10-CM | POA: Diagnosis not present

## 2022-02-22 DIAGNOSIS — C189 Malignant neoplasm of colon, unspecified: Secondary | ICD-10-CM | POA: Insufficient documentation

## 2022-02-22 DIAGNOSIS — E785 Hyperlipidemia, unspecified: Secondary | ICD-10-CM

## 2022-02-22 LAB — POCT GLYCOSYLATED HEMOGLOBIN (HGB A1C): Hemoglobin A1C: 9.7 % — AB (ref 4.0–5.6)

## 2022-02-22 LAB — POCT UA - MICROALBUMIN
Albumin/Creatinine Ratio, Urine, POC: <5.6
Creatinine, POC: 89.2 mg/dL
Microalbumin Ur, POC: <5 mg/L

## 2022-02-22 MED ORDER — TRULICITY 0.75 MG/0.5ML ~~LOC~~ SOAJ: 0.7500 mg | SUBCUTANEOUS | 1 refills | Status: DC

## 2022-02-22 NOTE — Progress Notes (Signed)
   Subjective:    Patient ID: Trevor Johnson, male    DOB: 1961/05/05, 61 y.o.   MRN: [id number]  HPI He is here for an interval evaluation.  He was recently seen in an urgent care center because of difficulty with chest pain but no associated diaphoresis, weakness, shortness of breath.  This has been going on intermittently over the last several months.  He also has a history of adeno cancer of the colon and is followed up regularly on that.  He also sees GI for cirrhosis as well as follow-up on his tubular adenoma.  He does not to drink.  He also complains of difficulty taking the Jardiance saying that it causes excessive urination and he is not happy with this.   Review of Systems     Objective:   Physical Exam Alert and in no distress. Tympanic membranes and canals are normal. Pharyngeal area is normal. Neck is supple without adenopathy or thyromegaly. Cardiac exam shows a regular sinus rhythm without murmurs or gallops. Lungs are clear to auscultation. Hemoglobin A1c is 9.7  EKG read by me shows a rate of 79 with other parameters being normal.  No abnormal findings were seen.     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, without long-term current use of insulin, unspecified retinopathy severity ([place]) - Plan: HgB A1c, CBC with Differential/Platelet, Comprehensive metabolic panel, Lipid panel, POCT UA - Microalbumin, Dulaglutide (TRULICITY) 6.59 DJ/5.7SV SOPN  Hyperlipidemia associated with type 2 diabetes mellitus ([place]) - Plan: Lipid panel  Hypertension associated with diabetes ([place]) - Plan: CBC with Differential/Platelet, Comprehensive metabolic panel  Obesity (BMI 30-39.9)  Adverse effect of metformin, subsequent encounter  Chest pain, unspecified type - Plan: Ambulatory referral to Cardiology  Tubular adenoma of colon  Cirrhosis of liver without ascites, unspecified hepatic cirrhosis type ([place]) Discussed the  need for him to come back on a more regular basis.  He will stop the Jardiance since he is not tolerating that.  I will switch him to Trulicity.  He is to return here in 1 month for follow-up on that.  Refer to cardiology for further evaluation of his chest pain.  Continue to be followed by GI.  Continue on his other medications.

## 2022-02-23 ENCOUNTER — Encounter: Payer: Self-pay | Admitting: Family Medicine

## 2022-02-23 LAB — CBC WITH DIFFERENTIAL/PLATELET
Basophils Absolute: 0.1 10*3/uL (ref 0.0–0.2)
Basos: 1 %
EOS (ABSOLUTE): 0.1 10*3/uL (ref 0.0–0.4)
Eos: 2 %
Hematocrit: 45.7 % (ref 37.5–51.0)
Hemoglobin: 16.3 g/dL (ref 13.0–17.7)
Immature Grans (Abs): 0 10*3/uL (ref 0.0–0.1)
Immature Granulocytes: 1 %
Lymphocytes Absolute: 2 10*3/uL (ref 0.7–3.1)
Lymphs: 33 %
MCH: 34 pg — ABNORMAL HIGH (ref 26.6–33.0)
MCHC: 35.7 g/dL (ref 31.5–35.7)
MCV: 95 fL (ref 79–97)
Monocytes Absolute: 0.6 10*3/uL (ref 0.1–0.9)
Monocytes: 10 %
Neutrophils Absolute: 3.2 10*3/uL (ref 1.4–7.0)
Neutrophils: 53 %
Platelets: 240 10*3/uL (ref 150–450)
RBC: 4.8 x10E6/uL (ref 4.14–5.80)
RDW: 13 % (ref 11.6–15.4)
WBC: 5.9 10*3/uL (ref 3.4–10.8)

## 2022-02-23 LAB — LIPID PANEL
Chol/HDL Ratio: 7.7 ratio — ABNORMAL HIGH (ref 0.0–5.0)
Cholesterol, Total: 162 mg/dL (ref 100–199)
HDL: 21 mg/dL — ABNORMAL LOW (ref >39)
LDL Chol Calc (NIH): 34 mg/dL (ref 0–99)
Triglycerides: 796 mg/dL (ref 0–149)
VLDL Cholesterol Cal: 107 mg/dL — ABNORMAL HIGH (ref 5–40)

## 2022-02-23 LAB — COMPREHENSIVE METABOLIC PANEL
ALT: 78 IU/L — ABNORMAL HIGH (ref 0–44)
AST: 69 IU/L — ABNORMAL HIGH (ref 0–40)
Albumin/Globulin Ratio: 1.7 (ref 1.2–2.2)
Albumin: 4.4 g/dL (ref 3.8–4.9)
Alkaline Phosphatase: 96 IU/L (ref 44–121)
BUN/Creatinine Ratio: 14 (ref 9–20)
BUN: 13 mg/dL (ref 6–24)
Bilirubin Total: 0.5 mg/dL (ref 0.0–1.2)
CO2: 20 mmol/L (ref 20–29)
Calcium: 9.3 mg/dL (ref 8.7–10.2)
Chloride: 98 mmol/L (ref 96–106)
Creatinine, Ser: 0.9 mg/dL (ref 0.76–1.27)
Globulin, Total: 2.6 g/dL (ref 1.5–4.5)
Glucose: 156 mg/dL — ABNORMAL HIGH (ref 70–99)
Potassium: 4.1 mmol/L (ref 3.5–5.2)
Sodium: 134 mmol/L (ref 134–144)
Total Protein: 7 g/dL (ref 6.0–8.5)
eGFR: 98 mL/min/{1.73_m2} (ref >59)

## 2022-02-23 MED ORDER — FENOFIBRATE 145 MG PO TABS: 145.0000 mg | ORAL_TABLET | Freq: Every day | ORAL | 3 refills | Status: DC

## 2022-02-23 NOTE — Addendum Note (Signed)
Addended by: Denita Lung on: 02/23/2022 01:03 PM   Modules accepted: Orders

## 2022-02-24 NOTE — Addendum Note (Signed)
Addended by: Denita Lung on: 02/24/2022 08:10 AM   Modules accepted: Orders

## 2022-02-27 ENCOUNTER — Encounter: Payer: Self-pay | Admitting: Family Medicine

## 2022-03-01 ENCOUNTER — Ambulatory Visit (INDEPENDENT_AMBULATORY_CARE_PROVIDER_SITE_OTHER): Payer: 59 | Admitting: Cardiology

## 2022-03-01 ENCOUNTER — Encounter: Payer: Self-pay | Admitting: Cardiology

## 2022-03-01 VITALS — BP 110/70 | HR 70 | Ht 72.0 in | Wt 249.0 lb

## 2022-03-01 DIAGNOSIS — K7460 Unspecified cirrhosis of liver: Secondary | ICD-10-CM | POA: Diagnosis not present

## 2022-03-01 DIAGNOSIS — R072 Precordial pain: Secondary | ICD-10-CM | POA: Diagnosis not present

## 2022-03-01 DIAGNOSIS — E1169 Type 2 diabetes mellitus with other specified complication: Secondary | ICD-10-CM

## 2022-03-01 DIAGNOSIS — R079 Chest pain, unspecified: Secondary | ICD-10-CM

## 2022-03-01 DIAGNOSIS — E785 Hyperlipidemia, unspecified: Secondary | ICD-10-CM

## 2022-03-01 MED ORDER — METOPROLOL TARTRATE 100 MG PO TABS: 100.0000 mg | ORAL_TABLET | Freq: Once | ORAL | 0 refills | Status: DC

## 2022-03-01 NOTE — Patient Instructions (Addendum)
Medication Instructions:  Your physician recommends that you continue on your current medications as directed. Please refer to the Current Medication list given to you today.  *If you need a refill on your cardiac medications before your next appointment, please call your pharmacy*  Lab Work: NONE  Testing/Procedures: Your physician has requested you have a Coronary CTA performed.  Follow-Up: At [facility], you and your health needs are our priority.  As part of our continuing mission to provide you with exceptional heart care, we have created designated Provider Care Teams.  These Care Teams include your primary Cardiologist (physician) and Advanced Practice Providers (APPs -  Physician Assistants and Nurse Practitioners) who all work together to provide you with the care you need, when you need it.  Your next appointment:   1 year(s)  The format for your next appointment:   In Person  Provider:   Candee Furbish, MD {   Other Instructions   Your cardiac CT will be scheduled at the below location:   [facility] [place], [place] [address] [phone]  Please arrive at the [facility] and Children's Entrance (Entrance C2) of [facility] 30 minutes prior to test start time. You can use the FREE valet parking offered at entrance C (encouraged to control the heart rate for the test)  Proceed to the [facility] Radiology Department (first floor) to check-in and test prep.  All radiology patients and guests should use entrance C2 at [facility], accessed from [facility], even though the hospital's physical address listed is [address]    Please follow these instructions carefully (unless otherwise directed):  Hold all erectile dysfunction medications at least 3 days (72 hrs) prior to test.  On the Night Before the Test: Be sure to Drink plenty of water. Do not consume any caffeinated/decaffeinated beverages  or chocolate 12 hours prior to your test. Do not take any antihistamines 12 hours prior to your test.  On the Day of the Test: Drink plenty of water until 1 hour prior to the test. Do not eat any food 4 hours prior to the test. You may take your regular medications prior to the test.  Take metoprolol (Lopressor) '100mg'$  two hours prior to test.  After the Test: Drink plenty of water. After receiving IV contrast, you may experience a mild flushed feeling. This is normal. On occasion, you may experience a mild rash up to 24 hours after the test. This is not dangerous. If this occurs, you can take Benadryl 25 mg and increase your fluid intake. If you experience trouble breathing, this can be serious. If it is severe call 911 IMMEDIATELY. If it is mild, please call our office. If you take any of these medications: Glipizide/Metformin, Avandament, Glucavance, please do not take 48 hours after completing test unless otherwise instructed.  We will call to schedule your test 2-4 weeks out understanding that some insurance companies will need an authorization prior to the service being performed.   For non-scheduling related questions, please contact the cardiac imaging nurse navigator should you have any questions/concerns: Marchia Bond, Cardiac Imaging Nurse Navigator Gordy Clement, Cardiac Imaging Nurse Navigator [facility] Direct Office Dial: [phone]   For scheduling needs, including cancellations and rescheduling, please call [place], [phone].   Important Information About Sugar

## 2022-03-01 NOTE — Progress Notes (Signed)
Cardiology Office Note:    Date:  03/01/2022   ID:  TECUMSEH YEAGLEY, DOB [birth year], MRN [id number]  PCP:  Denita Lung, MD   [facility] Providers Cardiologist:  Candee Furbish, MD     Referring MD: Denita Lung, MD    History of Present Illness:    Trevor Johnson is a 61 y.o. male here with family history father dying of heart disease at age 53, evaluation at the request of Dr. Redmond School.  Has had episodes of chest pain weakness shortness of breath.  Some numbness in left chest. About 10 days had sharp chest pain. Went to Urgent care.  Has diabetes, family history as above, hyperlipidemia.  Also has cirrhosis of the liver.  Did not tolerate Jardiance, dehydration.   EKG shows sinus rhythm 79 with no other abnormalities.  He has battled hyperlipidemia, mixed with Zetia 10 mg fenofibrate 145 mg Vascepa.  Last triglycerides on 02/22/2022 were 796.  Past Medical History:  Diagnosis Date   Allergy    RHINITIS   Cirrhosis of liver ([place]) 02/2021   Colon cancer ([place]) 01/2021   malignant colon polyp (sigmoid colon)   Diabetes mellitus    Fatty liver    Hypertension    Hypertriglyceridemia    Obesity    Renal stone     Past Surgical History:  Procedure Laterality Date   COLONOSCOPY     URETHRAL STRICTURE DILATATION      Current Medications: Current Meds  Medication Sig   Dulaglutide (TRULICITY) 1.19 ER/7.4YC SOPN Inject 0.75 mg into the skin once a week.   ezetimibe (ZETIA) 10 MG tablet TAKE 1 TABLET DAILY   fenofibrate (TRICOR) 145 MG tablet Take 1 tablet (145 mg total) by mouth daily.   icosapent Ethyl (VASCEPA) 1 g capsule Take 2 capsules (2 g total) by mouth 2 (two) times daily.   lisinopril-hydrochlorothiazide (ZESTORETIC) 10-12.5 MG tablet TAKE 1 TABLET BY MOUTH EVERY DAY   metoprolol tartrate (LOPRESSOR) 100 MG tablet Take 1 tablet (100 mg total) by mouth once for 1 dose. Take 90-120 minutes prior to scan.   omeprazole (PRILOSEC) 40 MG capsule TAKE 1 CAPSULE (40  MG TOTAL) BY MOUTH IN THE MORNING AND AT BEDTIME.   sildenafil (REVATIO) 20 MG tablet Take up to 5 pills as needed daily     Allergies:   Lipitor [atorvastatin] and Crestor [rosuvastatin calcium]   Social History   Socioeconomic History   Marital status: Married    Spouse name: Not on file   Number of children: Not on file   Years of education: Not on file   Highest education level: Not on file  Occupational History   Not on file  Tobacco Use   Smoking status: Never   Smokeless tobacco: Never  Vaping Use   Vaping Use: Never used  Substance and Sexual Activity   Alcohol use: No   Drug use: No   Sexual activity: Yes    Birth control/protection: Surgical  Other Topics Concern   Not on file  Social History Narrative   Not on file   Social Determinants of Health   Financial Resource Strain: Not on file  Food Insecurity: Not on file  Transportation Needs: Not on file  Physical Activity: Not on file  Stress: Not on file  Social Connections: Not on file     Family History: The patient's family history includes Colon cancer in his maternal grandfather. There is no history of Colon polyps, Esophageal cancer, Rectal  cancer, or Stomach cancer.  ROS:   Please see the history of present illness.    Denies any fevers chills nausea vomiting syncope bleeding all other systems reviewed and are negative.  EKGs/Labs/Other Studies Reviewed:    The following studies were reviewed today: Prior office notes, CT scan of abdomen pelvis personally reviewed and interpreted.  I do not see any obvious coronary calcification.  Coronary CT scan will be more specific.  EKG: Normal sinus rhythm with no ischemic changes as described above.  Personally reviewed and interpreted.  Recent Labs: 02/22/2022: ALT 78; BUN 13; Creatinine, Ser 0.90; Hemoglobin 16.3; Platelets 240; Potassium 4.1; Sodium 134  Recent Lipid Panel    Component Value Date/Time   CHOL 162 02/22/2022 1647   TRIG 796 (HH)  02/22/2022 1647   HDL 21 (L) 02/22/2022 1647   CHOLHDL 7.7 (H) 02/22/2022 1647   CHOLHDL 10.5 (H) 05/22/2017 1334   VLDL NOT CALC 05/22/2017 1334   LDLCALC 34 02/22/2022 1647     Risk Assessment/Calculations:              Physical Exam:    VS:  BP 110/70 (BP Location: Left Arm, Patient Position: Sitting, Cuff Size: Normal)   Pulse 70   Ht 6' (1.829 m)   Wt 249 lb (112.9 kg)   SpO2 96%   BMI 33.77 kg/m     Wt Readings from Last 3 Encounters:  03/01/22 249 lb (112.9 kg)  02/22/22 245 lb 9.6 oz (111.4 kg)  10/24/21 215 lb (97.5 kg)     GEN:  Well nourished, well developed in no acute distress HEENT: Normal NECK: No JVD; No carotid bruits LYMPHATICS: No lymphadenopathy CARDIAC: RRR, no murmurs, no rubs, gallops RESPIRATORY:  Clear to auscultation without rales, wheezing or rhonchi  ABDOMEN: Soft, non-tender, non-distended MUSCULOSKELETAL:  No edema; No deformity  SKIN: Warm and dry NEUROLOGIC:  Alert and oriented x 3 PSYCHIATRIC:  Normal affect   ASSESSMENT:    1. Chest pain, unspecified type   2. Precordial chest pain   3. Hyperlipidemia associated with type 2 diabetes mellitus ([place])   4. Cirrhosis of liver without ascites, unspecified hepatic cirrhosis type (HCC)    PLAN:    In order of problems listed above:  Precordial chest pain Given his risk factors including diabetes father having heart attack age 20 and dying, hypertriglyceridemia, we will go ahead and check a coronary CT scan.  This will help determine if coronary plaque is present, stenosis etc.  We will let him know the results of studies.  Hyperlipidemia associated with type 2 diabetes mellitus On several different agents to help him with his hypertriglyceridemia.  Triglycerides were in the 700 range.  This does place him at risk for pancreatitis.  Currently on Zetia fenofibrate and Vascepa.  It looks like he has had trouble in the past with statins.  If necessary, we could always utilize our  pharmacy lead lipid clinic here if he needs further assistance.  I explained to him that if hemoglobin A1c is elevated, it is challenging to decrease triglycerides.  He will continue to try to lose weight.  Now on Trulicity.  His mother had hypertriglyceridemia.  Likely a genetic trait.  Cirrhosis of liver without ascites ([place]) Followed by Dr. Havery Moros.  Prior note reviewed.         Medication Adjustments/Labs and Tests Ordered: Current medicines are reviewed at length with the patient today.  Concerns regarding medicines are outlined above.  Orders Placed This Encounter  Procedures   CT CORONARY MORPH W/CTA COR W/SCORE W/CA W/CM &/OR WO/CM   Meds ordered this encounter  Medications   metoprolol tartrate (LOPRESSOR) 100 MG tablet    Sig: Take 1 tablet (100 mg total) by mouth once for 1 dose. Take 90-120 minutes prior to scan.    Dispense:  1 tablet    Refill:  0    Patient Instructions  Medication Instructions:  Your physician recommends that you continue on your current medications as directed. Please refer to the Current Medication list given to you today.  *If you need a refill on your cardiac medications before your next appointment, please call your pharmacy*  Lab Work: NONE  Testing/Procedures: Your physician has requested you have a Coronary CTA performed.  Follow-Up: At [facility], you and your health needs are our priority.  As part of our continuing mission to provide you with exceptional heart care, we have created designated Provider Care Teams.  These Care Teams include your primary Cardiologist (physician) and Advanced Practice Providers (APPs -  Physician Assistants and Nurse Practitioners) who all work together to provide you with the care you need, when you need it.  Your next appointment:   1 year(s)  The format for your next appointment:   In Person  Provider:   Candee Furbish, MD {   Other Instructions   Your cardiac CT will be scheduled at  the below location:   [facility] [place], [place] [address] [phone]  Please arrive at the [facility] and Children's Entrance (Entrance C2) of [facility] 30 minutes prior to test start time. You can use the FREE valet parking offered at entrance C (encouraged to control the heart rate for the test)  Proceed to the [facility] Radiology Department (first floor) to check-in and test prep.  All radiology patients and guests should use entrance C2 at [facility], accessed from [facility], even though the hospital's physical address listed is [address]    Please follow these instructions carefully (unless otherwise directed):  Hold all erectile dysfunction medications at least 3 days (72 hrs) prior to test.  On the Night Before the Test: Be sure to Drink plenty of water. Do not consume any caffeinated/decaffeinated beverages or chocolate 12 hours prior to your test. Do not take any antihistamines 12 hours prior to your test.  On the Day of the Test: Drink plenty of water until 1 hour prior to the test. Do not eat any food 4 hours prior to the test. You may take your regular medications prior to the test.  Take metoprolol (Lopressor) '100mg'$  two hours prior to test.  After the Test: Drink plenty of water. After receiving IV contrast, you may experience a mild flushed feeling. This is normal. On occasion, you may experience a mild rash up to 24 hours after the test. This is not dangerous. If this occurs, you can take Benadryl 25 mg and increase your fluid intake. If you experience trouble breathing, this can be serious. If it is severe call 911 IMMEDIATELY. If it is mild, please call our office. If you take any of these medications: Glipizide/Metformin, Avandament, Glucavance, please do not take 48 hours after completing test unless otherwise instructed.  We will call to schedule your test 2-4 weeks out  understanding that some insurance companies will need an authorization prior to the service being performed.   For non-scheduling related questions, please contact the cardiac imaging  nurse navigator should you have any questions/concerns: Marchia Bond, Cardiac Imaging Nurse Navigator Gordy Clement, Cardiac Imaging Nurse Navigator [facility] Direct Office Dial: [phone]   For scheduling needs, including cancellations and rescheduling, please call [place], [phone].   Important Information About Sugar         Signed, Candee Furbish, MD  03/01/2022 9:37 AM     [facility]

## 2022-03-01 NOTE — Assessment & Plan Note (Signed)
Followed by Dr. Havery Moros.  Prior note reviewed.

## 2022-03-01 NOTE — Assessment & Plan Note (Signed)
Given his risk factors including diabetes father having heart attack age 59 and dying, hypertriglyceridemia, we will go ahead and check a coronary CT scan.  This will help determine if coronary plaque is present, stenosis etc.  We will let him know the results of studies.

## 2022-03-01 NOTE — Assessment & Plan Note (Signed)
On several different agents to help him with his hypertriglyceridemia.  Triglycerides were in the 700 range.  This does place him at risk for pancreatitis.  Currently on Zetia fenofibrate and Vascepa.  It looks like he has had trouble in the past with statins.  If necessary, we could always utilize our pharmacy lead lipid clinic here if he needs further assistance.  I explained to him that if hemoglobin A1c is elevated, it is challenging to decrease triglycerides.  He will continue to try to lose weight.  Now on Trulicity.  His mother had hypertriglyceridemia.  Likely a genetic trait.

## 2022-03-13 ENCOUNTER — Other Ambulatory Visit: Payer: Self-pay | Admitting: Cardiology

## 2022-03-13 MED ORDER — METOPROLOL TARTRATE 100 MG PO TABS: 100.0000 mg | ORAL_TABLET | Freq: Once | ORAL | 0 refills | Status: DC

## 2022-03-20 ENCOUNTER — Telehealth (HOSPITAL_COMMUNITY): Payer: Self-pay | Admitting: Emergency Medicine

## 2022-03-21 ENCOUNTER — Encounter (HOSPITAL_COMMUNITY): Payer: Self-pay

## 2022-03-21 ENCOUNTER — Ambulatory Visit (HOSPITAL_COMMUNITY)
Admission: RE | Admit: 2022-03-21 | Discharge: 2022-03-21 | Disposition: A | Discharge status: Home / Self Care | Payer: 59 | Source: Ambulatory Visit | Attending: Cardiology | Admitting: Cardiology

## 2022-03-21 DIAGNOSIS — R072 Precordial pain: Secondary | ICD-10-CM

## 2022-03-21 DIAGNOSIS — R079 Chest pain, unspecified: Secondary | ICD-10-CM | POA: Insufficient documentation

## 2022-03-21 MED ORDER — METOPROLOL TARTRATE 5 MG/5ML IV SOLN
5.0000 mg | INTRAVENOUS | Status: DC | PRN
  Administered 2022-03-21: 5 mg via INTRAVENOUS

## 2022-03-21 MED ORDER — NITROGLYCERIN 0.4 MG SL SUBL
0.8000 mg | SUBLINGUAL_TABLET | Freq: Once | SUBLINGUAL | Status: AC
Start: 2022-03-21 — End: 2022-03-21
  Administered 2022-03-21: 0.8 mg via SUBLINGUAL

## 2022-03-21 MED ORDER — NITROGLYCERIN 0.4 MG SL SUBL
SUBLINGUAL_TABLET | SUBLINGUAL | Status: AC
  Filled 2022-03-21: qty 2

## 2022-03-21 MED ORDER — IOHEXOL 350 MG/ML SOLN
100.0000 mL | Freq: Once | INTRAVENOUS | Status: AC | PRN
  Administered 2022-03-21: 100 mL via INTRAVENOUS

## 2022-03-21 MED ORDER — METOPROLOL TARTRATE 5 MG/5ML IV SOLN
INTRAVENOUS | Status: AC
  Administered 2022-03-21: 5 mg via INTRAVENOUS
  Filled 2022-03-21: qty 10

## 2022-03-22 ENCOUNTER — Telehealth: Payer: Self-pay

## 2022-03-22 ENCOUNTER — Telehealth (INDEPENDENT_AMBULATORY_CARE_PROVIDER_SITE_OTHER): Payer: 59 | Admitting: Family Medicine

## 2022-03-22 VITALS — Ht 72.0 in | Wt 249.0 lb

## 2022-03-22 DIAGNOSIS — T383X5A Adverse effect of insulin and oral hypoglycemic [antidiabetic] drugs, initial encounter: Secondary | ICD-10-CM

## 2022-03-22 DIAGNOSIS — T383X5D Adverse effect of insulin and oral hypoglycemic [antidiabetic] drugs, subsequent encounter: Secondary | ICD-10-CM

## 2022-03-22 DIAGNOSIS — E11319 Type 2 diabetes mellitus with unspecified diabetic retinopathy without macular edema: Secondary | ICD-10-CM

## 2022-03-22 MED ORDER — TRULICITY 1.5 MG/0.5ML ~~LOC~~ SOAJ: 1.5000 mg | SUBCUTANEOUS | 1 refills | Status: DC

## 2022-03-22 NOTE — Progress Notes (Signed)
   Subjective:    Patient ID: Trevor Johnson, male    DOB: [birth year], 61 y.o.   MRN: [id number]  HPI Documentation for virtual audio and video telecommunications through [place] encounter: The patient was located at home. 2 patient identifiers used.  The provider was located in the office. The patient did consent to this visit and is aware of possible charges through their insurance for this visit. The other persons participating in this telemedicine service were none. Time spent on call was 5 minutes and in review of previous records >15 minutes total for counseling and coordination of care. This virtual service is not related to other E/M service within previous 7 days.  He notes that since starting the Trulicity, he has had more energy, sleeping better, not as thirsty.  He is does state that his blood sugars in the morning are still in the 200 range.  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

## 2022-03-22 NOTE — Telephone Encounter (Signed)
Called pt. Had to LM to schedule 1 month f/u virtually and diabetes check in office in Oct.

## 2022-04-17 ENCOUNTER — Encounter: Payer: Self-pay | Admitting: Gastroenterology

## 2022-04-18 ENCOUNTER — Other Ambulatory Visit: Payer: Self-pay | Admitting: Family Medicine

## 2022-04-18 DIAGNOSIS — E11319 Type 2 diabetes mellitus with unspecified diabetic retinopathy without macular edema: Secondary | ICD-10-CM

## 2022-04-19 ENCOUNTER — Encounter: Payer: Self-pay | Admitting: Family Medicine

## 2022-04-19 ENCOUNTER — Other Ambulatory Visit: Payer: Self-pay | Admitting: Family Medicine

## 2022-04-19 DIAGNOSIS — E1159 Type 2 diabetes mellitus with other circulatory complications: Secondary | ICD-10-CM

## 2022-04-19 DIAGNOSIS — E781 Pure hyperglyceridemia: Secondary | ICD-10-CM

## 2022-04-19 DIAGNOSIS — E1169 Type 2 diabetes mellitus with other specified complication: Secondary | ICD-10-CM

## 2022-04-20 ENCOUNTER — Other Ambulatory Visit: Payer: Self-pay

## 2022-04-20 DIAGNOSIS — E11319 Type 2 diabetes mellitus with unspecified diabetic retinopathy without macular edema: Secondary | ICD-10-CM

## 2022-04-20 MED ORDER — TRULICITY 1.5 MG/0.5ML ~~LOC~~ SOAJ: 1.5000 mg | SUBCUTANEOUS | 1 refills | Status: DC

## 2022-05-16 ENCOUNTER — Ambulatory Visit (INDEPENDENT_AMBULATORY_CARE_PROVIDER_SITE_OTHER): Payer: 59 | Admitting: Family Medicine

## 2022-05-16 ENCOUNTER — Encounter: Payer: Self-pay | Admitting: Family Medicine

## 2022-05-16 VITALS — BP 102/62 | HR 73 | Temp 98.4°F | Ht 72.0 in | Wt 250.8 lb

## 2022-05-16 DIAGNOSIS — E11319 Type 2 diabetes mellitus with unspecified diabetic retinopathy without macular edema: Secondary | ICD-10-CM | POA: Diagnosis not present

## 2022-05-16 DIAGNOSIS — Z23 Encounter for immunization: Secondary | ICD-10-CM

## 2022-05-16 MED ORDER — TRULICITY 3 MG/0.5ML ~~LOC~~ SOAJ: 3.0000 mg | SUBCUTANEOUS | 1 refills | Status: DC

## 2022-05-16 NOTE — Progress Notes (Signed)
   Subjective:    Patient ID: Trevor Johnson, male    DOB: June 15, 1961, 61 y.o.   MRN: [id number]  HPI He is here for recheck.  He has been giving the shots in his thigh and will not be switching to his abdomen.  He had some slight nausea at the beginning but none since then.  Presently is on 1.5 mg.   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  We will try to also work on getting his weight as well as his diabetes under better control.  Recheck here in 2 months.

## 2022-05-25 ENCOUNTER — Encounter: Payer: Self-pay | Admitting: Family Medicine

## 2022-05-26 ENCOUNTER — Encounter: Payer: Self-pay | Admitting: Family Medicine

## 2022-05-26 ENCOUNTER — Telehealth: Payer: Self-pay | Admitting: Family Medicine

## 2022-05-26 NOTE — Telephone Encounter (Signed)
Medical clearance Letter sent to patient

## 2022-05-26 NOTE — Telephone Encounter (Signed)
Letter typed & emailed to pt for dental clearance

## 2022-06-03 NOTE — Telephone Encounter (Signed)
done

## 2022-07-04 ENCOUNTER — Encounter: Payer: Self-pay | Admitting: Internal Medicine

## 2022-07-14 ENCOUNTER — Other Ambulatory Visit: Payer: Self-pay | Admitting: Gastroenterology

## 2022-07-14 ENCOUNTER — Other Ambulatory Visit: Payer: Self-pay | Admitting: Family Medicine

## 2022-07-14 DIAGNOSIS — E781 Pure hyperglyceridemia: Secondary | ICD-10-CM

## 2022-07-14 DIAGNOSIS — E1169 Type 2 diabetes mellitus with other specified complication: Secondary | ICD-10-CM

## 2022-07-14 DIAGNOSIS — E11319 Type 2 diabetes mellitus with unspecified diabetic retinopathy without macular edema: Secondary | ICD-10-CM

## 2022-07-14 DIAGNOSIS — E1159 Type 2 diabetes mellitus with other circulatory complications: Secondary | ICD-10-CM

## 2022-07-17 ENCOUNTER — Encounter: Payer: Self-pay | Admitting: Internal Medicine

## 2022-09-09 ENCOUNTER — Other Ambulatory Visit: Payer: Self-pay | Admitting: Gastroenterology

## 2022-09-10 IMAGING — CT CT CHEST-ABD-PELV W/ CM
2 of 5 series · 14 of 46 positions shown, 16 images · IV contrast (omnipaque)
Comparison: CT abdomen pelvis dated 12/17/2009.

CLINICAL DATA: 60-year-old male with colon cancer staging.

EXAM:
CT CHEST, ABDOMEN, AND PELVIS WITH CONTRAST
TECHNIQUE: Multidetector CT imaging of the chest, abdomen and pelvis was
performed following the standard protocol during bolus
administration of intravenous contrast.
CONTRAST:  100mL OMNIPAQUE IOHEXOL 300 MG/ML  SOLN

[Series 2: cap with · axial · 0.91mm/px · z∈[-652,-86]mm · 11 of 137 slices shown, 13 images]
[im 12/137  soft-tissue]
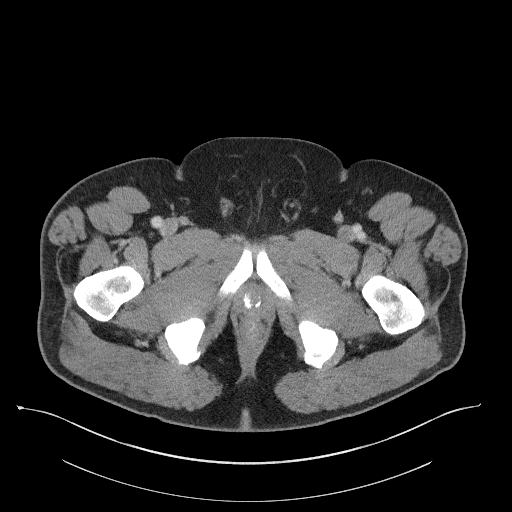
[im 12/137  bone]
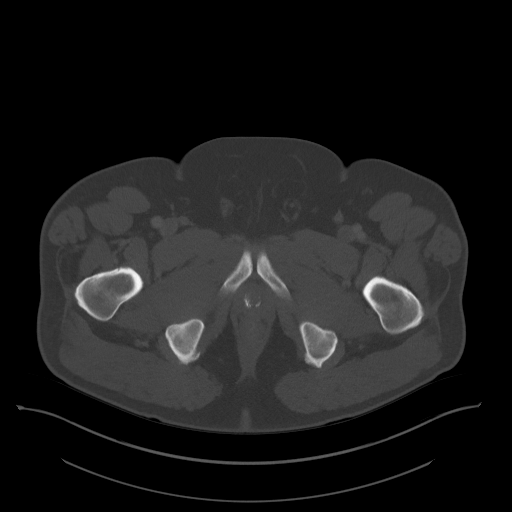
[im 23/137  soft-tissue]
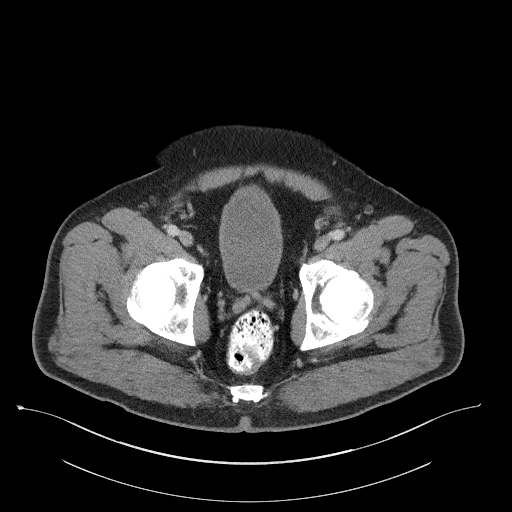
[im 35/137  soft-tissue]
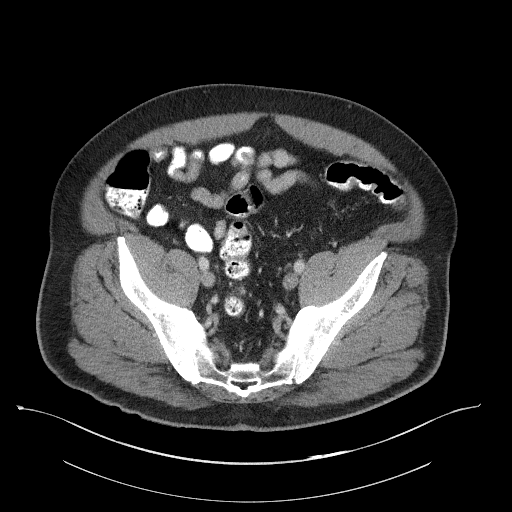
[im 46/137  soft-tissue]
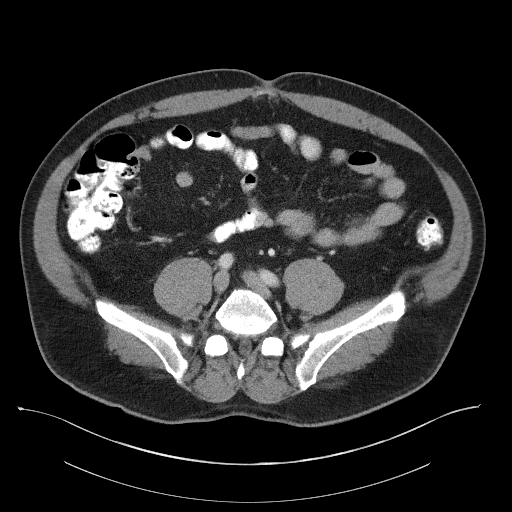
[im 57/137  soft-tissue]
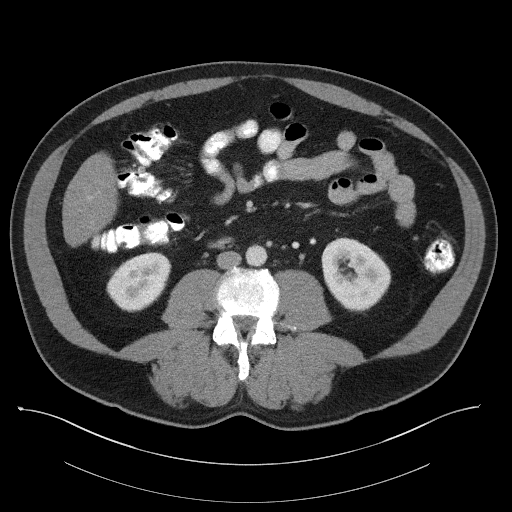
[im 69/137  soft-tissue]
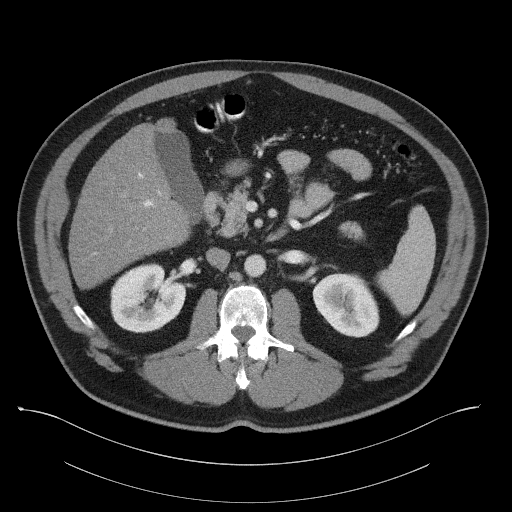
[im 80/137  soft-tissue]
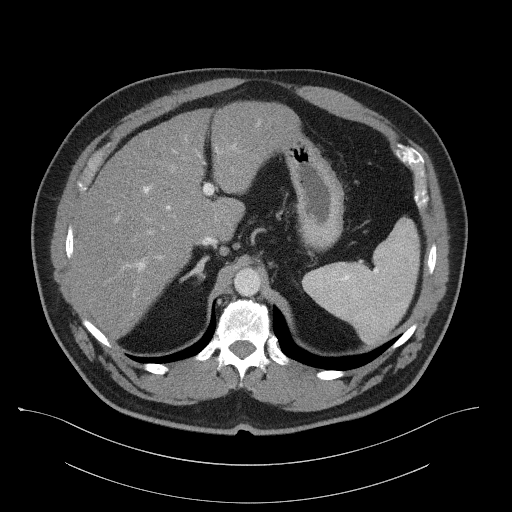
[im 91/137  soft-tissue]
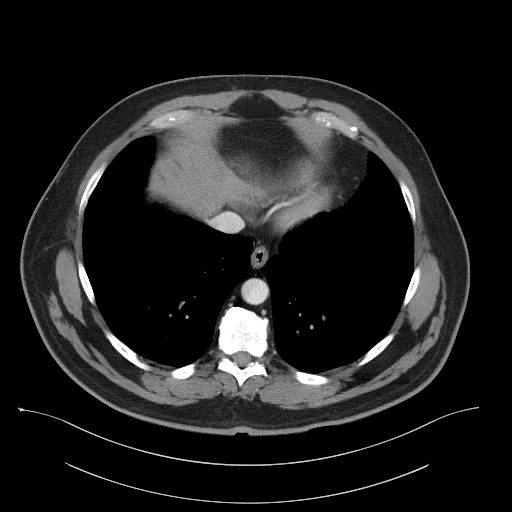
[im 103/137  soft-tissue]
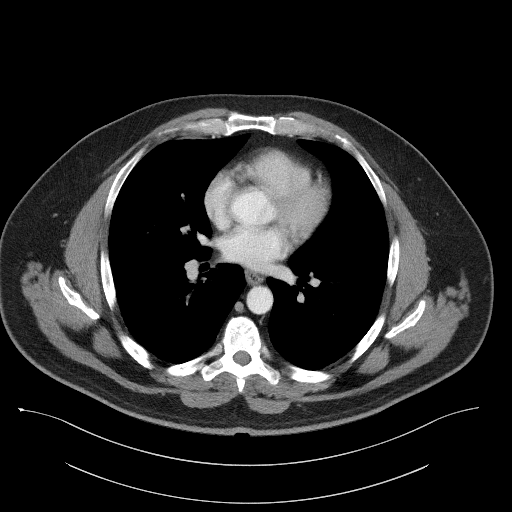
[im 103/137  bone]
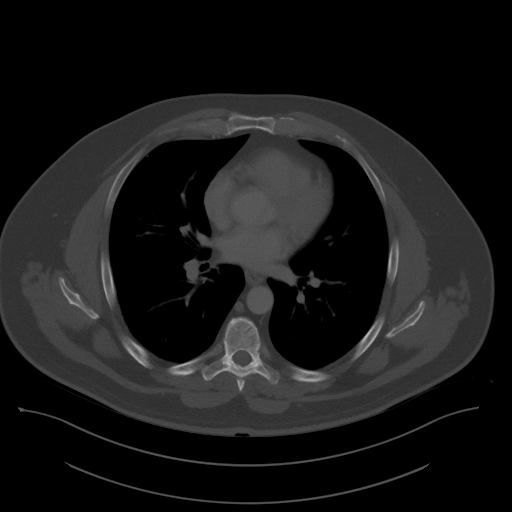
[im 114/137  soft-tissue]
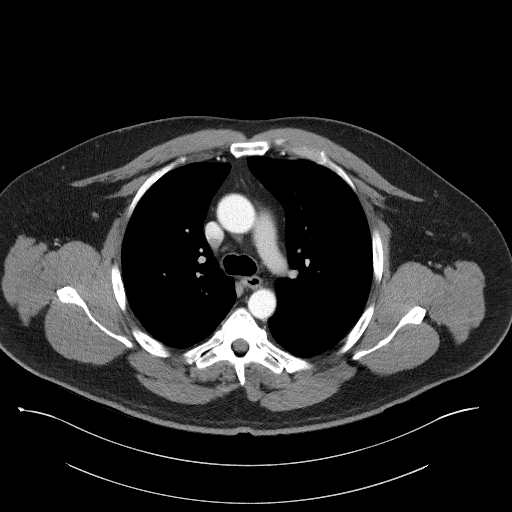
[im 125/137  soft-tissue]
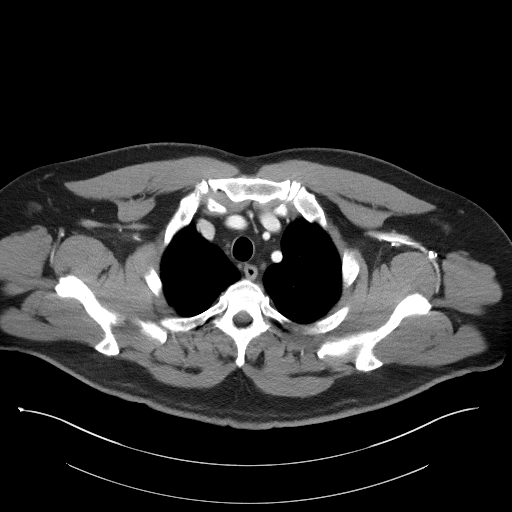

[Series 5: coronal · coronal · 0.91mm/px · 3 of 166 slices shown]
[im 56/166  soft-tissue]
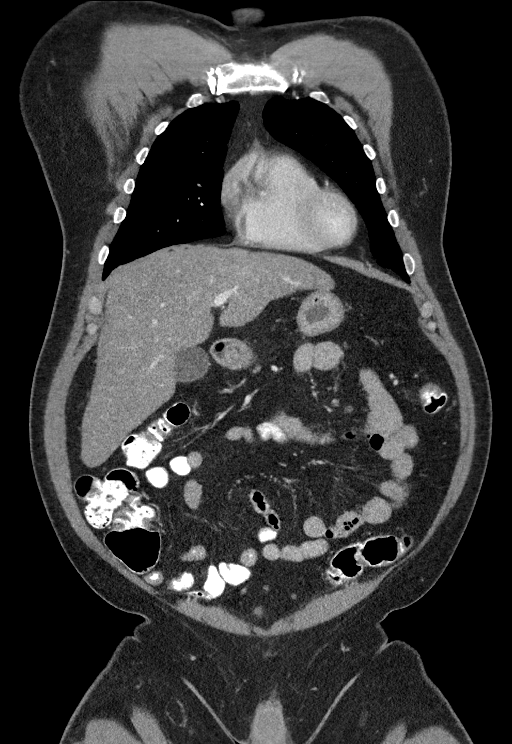
[im 74/166  soft-tissue]
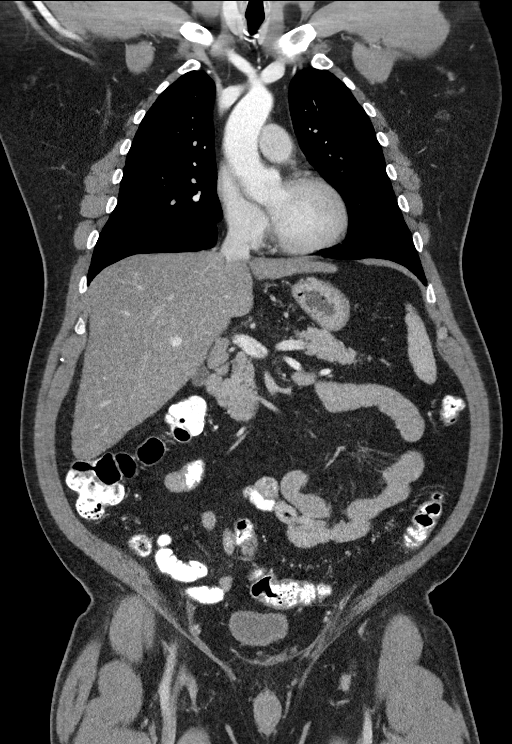
[im 92/166  soft-tissue]
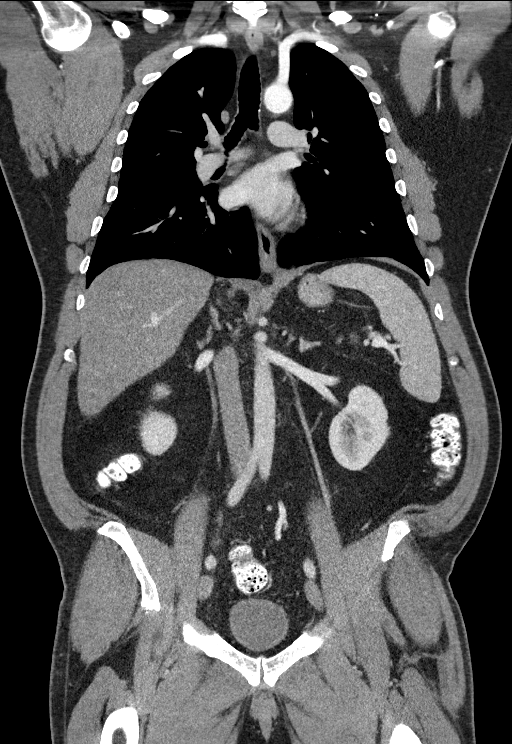

[14 of 46 positions shown; findings below may reference images not displayed]

FINDINGS: CT CHEST FINDINGS

Cardiovascular: There is no cardiomegaly or pericardial effusion.
The thoracic aorta is unremarkable. The origins of the great vessels
of the aortic arch appear patent as visualized. The central
pulmonary arteries are unremarkable for the degree of opacification.

Mediastinum/Nodes: No hilar or mediastinal adenopathy. The esophagus
and the thyroid gland are grossly unremarkable. No mediastinal fluid
collection.

Lungs/Pleura: The lungs are clear. There is no pleural effusion or
pneumothorax. The central airways are patent.

Musculoskeletal: Mild degenerative changes of the spine. No acute
osseous pathology.

CT ABDOMEN PELVIS FINDINGS

No intra-abdominal free air or free fluid.

Hepatobiliary: Diffuse fatty liver. No intrahepatic biliary ductal
dilatation. Apparent faint area of lobulation and lower attenuation
involving the anterior liver contour adjacent to the gallbladder
(sagittal 60/6), likely represents early changes of cirrhosis. An
underlying liver lesion is less likely. The gallbladder is
unremarkable.

Pancreas: Unremarkable. No pancreatic ductal dilatation or
surrounding inflammatory changes.

Spleen: Normal in size without focal abnormality.

Adrenals/Urinary Tract: The adrenal glands are unremarkable. There
is no hydronephrosis on either side. There is symmetric enhancement
and excretion of contrast by both kidneys. There is a 2 cm right
renal interpolar cyst. The visualized ureters and the urinary
bladder appear unremarkable.

Stomach/Bowel: There is no bowel obstruction or active inflammation.
The appendix is normal.

Vascular/Lymphatic: The abdominal aorta and IVC are unremarkable. No
portal venous gas. Minimally enlarged lymph node in the upper
abdomen measures 13 mm (62/2). Mildly enlarged periportal lymph node
measures 15 mm in short axis. These are slightly the year compared
to the CT of 0477. No other adenopathy.

Reproductive: The prostate and seminal vesicles are grossly
unremarkable. No pelvic mass.

Other: Status post prior umbilical hernia repair. No hernia.

Musculoskeletal: Mild degenerative changes of the spine. No acute
osseous pathology. Grade 1 L4-L5 anterolisthesis.
IMPRESSION: 1. No acute intrathoracic, abdominal, or pelvic pathology. No
evidence of metastatic disease in the chest, abdomen, or pelvis.
2. Fatty liver with early changes of cirrhosis.

## 2022-09-12 ENCOUNTER — Other Ambulatory Visit: Payer: Self-pay | Admitting: Family Medicine

## 2022-09-12 DIAGNOSIS — E11319 Type 2 diabetes mellitus with unspecified diabetic retinopathy without macular edema: Secondary | ICD-10-CM

## 2022-09-13 ENCOUNTER — Other Ambulatory Visit: Payer: Self-pay | Admitting: Family Medicine

## 2022-09-13 DIAGNOSIS — E1159 Type 2 diabetes mellitus with other circulatory complications: Secondary | ICD-10-CM

## 2022-09-27 ENCOUNTER — Ambulatory Visit
Admission: RE | Admit: 2022-09-27 | Discharge: 2022-09-27 | Disposition: A | Discharge status: Home / Self Care | Payer: 59 | Source: Ambulatory Visit | Attending: Family Medicine | Admitting: Family Medicine

## 2022-09-27 ENCOUNTER — Ambulatory Visit (INDEPENDENT_AMBULATORY_CARE_PROVIDER_SITE_OTHER): Payer: 59 | Admitting: Family Medicine

## 2022-09-27 ENCOUNTER — Encounter: Payer: Self-pay | Admitting: Family Medicine

## 2022-09-27 VITALS — BP 120/80 | HR 80 | Temp 97.7°F | Resp 18 | Wt 258.0 lb

## 2022-09-27 DIAGNOSIS — J40 Bronchitis, not specified as acute or chronic: Secondary | ICD-10-CM

## 2022-09-27 DIAGNOSIS — M542 Cervicalgia: Secondary | ICD-10-CM

## 2022-09-27 DIAGNOSIS — E11319 Type 2 diabetes mellitus with unspecified diabetic retinopathy without macular edema: Secondary | ICD-10-CM

## 2022-09-27 DIAGNOSIS — T383X5D Adverse effect of insulin and oral hypoglycemic [antidiabetic] drugs, subsequent encounter: Secondary | ICD-10-CM | POA: Diagnosis not present

## 2022-09-27 LAB — POCT GLYCOSYLATED HEMOGLOBIN (HGB A1C): Hemoglobin A1C: 8.5 % — AB (ref 4.0–5.6)

## 2022-09-27 MED ORDER — AZITHROMYCIN 500 MG PO TABS: 500.0000 mg | ORAL_TABLET | Freq: Every day | ORAL | 0 refills | Status: DC

## 2022-09-27 NOTE — Progress Notes (Signed)
   Subjective:    Patient ID: Trevor Johnson, male    DOB: Dec 07, 1960, 62 y.o.   MRN: [id number]  HPI He complains of a 1 month history of difficulty with a cough but no fever, chills, sore throat or earache.  He has tested negative for COVID.  He is also taking Trulicity for his diabetes and has been keeping track of his blood sugars usually just 3 times per week.  He cannot take metformin due to diarrhea.  At the end of the encounter he then mentioned difficulty with right neck pain that started after he fell off a ladder but did not injure his neck.  He states he landed on his feet but subsequent to that developed some right-sided neck pain that questionably radiates into the right shoulder triceps area.   Review of Systems     Objective:   Physical Exam Alert and in no distress. Tympanic membranes and canals are normal. Pharyngeal area is normal. Neck is supple without adenopathy or thyromegaly. Cardiac exam shows a regular sinus rhythm without murmurs or gallops. Lungs are clear to auscultation. Fairly good motion of the shoulder.  No palpable tenderness.  Normal strength and reflexes in the arms.  Hemoglobin A1c is 8.5.     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  Will call me after a week if he is not totally back to normal. I will get an x-ray on his neck to make sure there is nothing abnormal and if it shows nothing more than arthritic changes, refer to physical therapy for good neck rehab program. I discussed the diabetes with him in regard to diet, exercise,  medications strongly encouraged him to make further changes in his diet and exercise.  Explained that the neck step could easily be a combination of insulin and a GLP to help with his diabetes under better control.  Recheck on that in a couple months.

## 2022-09-28 NOTE — Addendum Note (Signed)
Addended by: Denita Lung on: 09/28/2022 12:36 PM   Modules accepted: Orders

## 2022-10-05 ENCOUNTER — Encounter: Payer: Self-pay | Admitting: Family Medicine

## 2022-10-05 DIAGNOSIS — J40 Bronchitis, not specified as acute or chronic: Secondary | ICD-10-CM

## 2022-10-06 MED ORDER — AZITHROMYCIN 500 MG PO TABS: 500.0000 mg | ORAL_TABLET | Freq: Every day | ORAL | 0 refills | Status: DC

## 2022-10-10 ENCOUNTER — Encounter: Payer: Self-pay | Admitting: Gastroenterology

## 2022-10-25 ENCOUNTER — Other Ambulatory Visit: Payer: Self-pay | Admitting: Family Medicine

## 2022-10-25 DIAGNOSIS — N529 Male erectile dysfunction, unspecified: Secondary | ICD-10-CM

## 2022-10-26 NOTE — Telephone Encounter (Signed)
Is it ok to refill?

## 2022-11-12 ENCOUNTER — Other Ambulatory Visit: Payer: Self-pay | Admitting: Family Medicine

## 2022-11-12 DIAGNOSIS — E781 Pure hyperglyceridemia: Secondary | ICD-10-CM

## 2022-11-12 DIAGNOSIS — E1169 Type 2 diabetes mellitus with other specified complication: Secondary | ICD-10-CM

## 2022-11-12 DIAGNOSIS — E11319 Type 2 diabetes mellitus with unspecified diabetic retinopathy without macular edema: Secondary | ICD-10-CM

## 2022-11-27 ENCOUNTER — Encounter: Payer: 59 | Admitting: Family Medicine

## 2023-01-08 ENCOUNTER — Other Ambulatory Visit: Payer: Self-pay | Admitting: Family Medicine

## 2023-01-08 DIAGNOSIS — E1159 Type 2 diabetes mellitus with other circulatory complications: Secondary | ICD-10-CM

## 2023-01-08 NOTE — Telephone Encounter (Signed)
Has upcoming appt in the next week

## 2023-01-10 ENCOUNTER — Telehealth: Payer: Self-pay

## 2023-01-10 NOTE — Telephone Encounter (Signed)
Received fax from pharmacy requesting new prescription for Trulicity /0.8ml pen. Last sent on 11/13/22, 3 ml sent with 1 refill. Has physical scheduled for 01/11/23.

## 2023-01-11 ENCOUNTER — Ambulatory Visit (INDEPENDENT_AMBULATORY_CARE_PROVIDER_SITE_OTHER): Payer: 59 | Admitting: Family Medicine

## 2023-01-11 ENCOUNTER — Encounter: Payer: Self-pay | Admitting: Family Medicine

## 2023-01-11 VITALS — BP 122/84 | HR 81 | Temp 97.6°F | Resp 18 | Ht 71.5 in | Wt 254.0 lb

## 2023-01-11 DIAGNOSIS — Z0000 Encounter for general adult medical examination without abnormal findings: Secondary | ICD-10-CM

## 2023-01-11 DIAGNOSIS — D126 Benign neoplasm of colon, unspecified: Secondary | ICD-10-CM | POA: Diagnosis not present

## 2023-01-11 DIAGNOSIS — K7460 Unspecified cirrhosis of liver: Secondary | ICD-10-CM

## 2023-01-11 DIAGNOSIS — I152 Hypertension secondary to endocrine disorders: Secondary | ICD-10-CM

## 2023-01-11 DIAGNOSIS — E669 Obesity, unspecified: Secondary | ICD-10-CM

## 2023-01-11 DIAGNOSIS — T383X5A Adverse effect of insulin and oral hypoglycemic [antidiabetic] drugs, initial encounter: Secondary | ICD-10-CM

## 2023-01-11 DIAGNOSIS — E11319 Type 2 diabetes mellitus with unspecified diabetic retinopathy without macular edema: Secondary | ICD-10-CM | POA: Diagnosis not present

## 2023-01-11 DIAGNOSIS — E785 Hyperlipidemia, unspecified: Secondary | ICD-10-CM

## 2023-01-11 DIAGNOSIS — N529 Male erectile dysfunction, unspecified: Secondary | ICD-10-CM

## 2023-01-11 DIAGNOSIS — E1159 Type 2 diabetes mellitus with other circulatory complications: Secondary | ICD-10-CM | POA: Diagnosis not present

## 2023-01-11 DIAGNOSIS — T383X5D Adverse effect of insulin and oral hypoglycemic [antidiabetic] drugs, subsequent encounter: Secondary | ICD-10-CM

## 2023-01-11 DIAGNOSIS — T466X5A Adverse effect of antihyperlipidemic and antiarteriosclerotic drugs, initial encounter: Secondary | ICD-10-CM

## 2023-01-11 DIAGNOSIS — E1169 Type 2 diabetes mellitus with other specified complication: Secondary | ICD-10-CM | POA: Diagnosis not present

## 2023-01-11 DIAGNOSIS — K219 Gastro-esophageal reflux disease without esophagitis: Secondary | ICD-10-CM

## 2023-01-11 LAB — POCT URINALYSIS DIP (PROADVANTAGE DEVICE)
Bilirubin, UA: NEGATIVE
Blood, UA: NEGATIVE
Glucose, UA: NEGATIVE mg/dL
Ketones, POC UA: NEGATIVE mg/dL
Nitrite, UA: NEGATIVE
Protein Ur, POC: NEGATIVE mg/dL
Specific Gravity, Urine: 1.015
Urobilinogen, Ur: 0.2
pH, UA: 6 (ref 5.0–8.0)

## 2023-01-11 LAB — POCT UA - MICROALBUMIN
Albumin/Creatinine Ratio, Urine, POC: 5.3
Creatinine, POC: 187.8 mg/dL
Microalbumin Ur, POC: 5.3 mg/L

## 2023-01-11 LAB — POCT GLYCOSYLATED HEMOGLOBIN (HGB A1C): Hemoglobin A1C: 7 % — AB (ref 4.0–5.6)

## 2023-01-11 MED ORDER — OMEPRAZOLE 40 MG PO CPDR: 40.0000 mg | DELAYED_RELEASE_CAPSULE | Freq: Two times a day (BID) | ORAL | 1 refills | Status: DC

## 2023-01-11 MED ORDER — TRULICITY 3 MG/0.5ML ~~LOC~~ SOAJ: 3.0000 mg | SUBCUTANEOUS | 1 refills | Status: DC

## 2023-01-11 MED ORDER — LISINOPRIL-HYDROCHLOROTHIAZIDE 10-12.5 MG PO TABS: 1.0000 | ORAL_TABLET | Freq: Every day | ORAL | 3 refills | Status: DC

## 2023-01-11 MED ORDER — FENOFIBRATE 145 MG PO TABS: 145.0000 mg | ORAL_TABLET | Freq: Every day | ORAL | 3 refills | Status: DC

## 2023-01-11 MED ORDER — PITAVASTATIN CALCIUM 1 MG PO TABS: 1.0000 mg | ORAL_TABLET | Freq: Every day | ORAL | 0 refills | Status: DC

## 2023-01-11 NOTE — Progress Notes (Signed)
Complete physical exam  Patient: Trevor Johnson   DOB: [birth year]   62 y.o. Male  MRN: [id number]  Subjective:    Chief Complaint  Patient presents with   Annual Exam    CPE. Fasting. No additional concerns.     CAMMERON Johnson is a 62 y.o. male who presents today for a complete physical exam. He reports consuming a general diet. The patient does not participate in regular exercise at present. He generally feels fairly well. He reports sleeping fairly well. He has a history of difficulty with lots of diabetes medications as well as lipid medications.  Presently he is taking Trulicity and is also on fenofibrate and Vascepa because of that.  His weight is fairly constant.  He does have reflux disease and is using Prilosec for control of that.  He has an appointment to be set up for colonoscopy.  He does not smoke.  He does have underlying ADD and does use sildenafil for that.  His marriage and home life are stable.  Otherwise family and social history as well as health maintenance and immunizations was reviewed.   Most recent fall risk assessment:    01/11/2023    8:39 AM  Fall Risk   Falls in the past year? 0  Number falls in past yr: 0  Injury with Fall? 0  Risk for fall due to : No Fall Risks  Follow up Falls evaluation completed     Most recent depression screenings:    01/11/2023    8:39 AM 09/27/2022   11:57 AM  PHQ 2/9 Scores  PHQ - 2 Score 0 0    Vision:Not within last year  and Dental: Receives regular dental care    Patient Care Team: Ronnald Nian, MD as PCP - General (Family Medicine) Jake Bathe, MD as PCP - Cardiology (Cardiology)   Outpatient Medications Prior to Visit  Medication Sig   sildenafil (REVATIO) 20 MG tablet TAKE UP TO 5 TABLETS BY MOUTH ONCE DAILY AS NEEDED   VASCEPA 1 g capsule TAKE 2 CAPSULES BY MOUTH TWICE A DAY   ezetimibe (ZETIA) 10 MG tablet TAKE 1 TABLET DAILY (Patient not taking: Reported on 09/27/2022)   [DISCONTINUED] azithromycin  (ZITHROMAX) 500 MG tablet Take 1 tablet (500 mg total) by mouth daily.   [DISCONTINUED] Dulaglutide (TRULICITY) 3 MG/0.5ML SOPN INJECT 3 MG AS DIRECTED ONCE A WEEK.   [DISCONTINUED] fenofibrate (TRICOR) 145 MG tablet Take 1 tablet (145 mg total) by mouth daily.   [DISCONTINUED] lisinopril-hydrochlorothiazide (ZESTORETIC) 10-12.5 MG tablet TAKE 1 TABLET BY MOUTH EVERY DAY   [DISCONTINUED] metoprolol tartrate (LOPRESSOR) 100 MG tablet Take 1 tablet (100 mg total) by mouth once for 1 dose. Take 90-120 minutes prior to scan.   [DISCONTINUED] omeprazole (PRILOSEC) 40 MG capsule TAKE 1 CAPSULE (40 MG TOTAL) BY MOUTH IN THE MORNING AND AT BEDTIME.   No facility-administered medications prior to visit.    Review of Systems  All other systems reviewed and are negative.         Objective:     BP 122/84   Pulse 81   Temp 97.6 F (36.4 C) (Oral)   Resp 18   Ht 5' 11.5" (1.816 m)   Wt 254 lb (115.2 kg)   SpO2 95% Comment: room air  BMI 34.93 kg/m    Physical Exam  Alert and in no distress. Tympanic membranes and canals are normal. Pharyngeal area is normal. Neck is supple without adenopathy or thyromegaly.  Cardiac exam shows a regular sinus rhythm without murmurs or gallops. Lungs are clear to auscultation.  Abdominal exam normal. Foot exam is normal Results for orders placed or performed in visit on 01/11/23  POCT glycosylated hemoglobin (Hb A1C)  Result Value Ref Range   Hemoglobin A1C 7.0 (A) 4.0 - 5.6 %  POCT UA - Microalbumin  Result Value Ref Range   Microalbumin Ur, POC 5.3 mg/L   Creatinine, POC 187.8 mg/dL   Albumin/Creatinine Ratio, Urine, POC 5.3   POCT Urinalysis DIP (Proadvantage Device)  Result Value Ref Range   Color, UA yellow yellow   Clarity, UA clear clear   Glucose, UA negative negative mg/dL   Bilirubin, UA negative negative   Ketones, POC UA negative negative mg/dL   Specific Gravity, Urine 1.015    Blood, UA negative negative   pH, UA 6.0 5.0 - 8.0    Protein Ur, POC negative negative mg/dL   Urobilinogen, Ur 0.2    Nitrite, UA Negative Negative   Leukocytes, UA Trace (A) Negative  Hemoglobin A1c is 7.0     Assessment & Plan:    Routine general medical examination at a health care facility - Plan: POCT Urinalysis DIP (Proadvantage Device)  Hypertension associated with diabetes - Plan: lisinopril-hydrochlorothiazide (ZESTORETIC) 10-12.5 MG tablet, CBC with Differential/Platelet, Comprehensive metabolic panel  Tubular adenoma of colon  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, without long-term current use of insulin, unspecified retinopathy severity - Plan: Dulaglutide (TRULICITY) 3 MG/0.5ML SOPN, POCT glycosylated hemoglobin (Hb A1C), POCT UA - Microalbumin  Hyperlipidemia associated with type 2 diabetes mellitus - Plan: fenofibrate (TRICOR) 145 MG tablet, Lipid panel, Pitavastatin Calcium 1 MG TABS  Vasculogenic erectile dysfunction, unspecified vasculogenic erectile dysfunction type  Obesity (BMI 30-39.9)  Adverse effect of sodium-glucose cotransporter 2 (SGLT2) inhibitor  Metformin adverse reaction, subsequent encounter  Adverse reaction to antihyperlipidemic drug, initial encounter  Erectile dysfunction, unspecified erectile dysfunction type  Gastroesophageal reflux disease, unspecified whether esophagitis present - Plan: omeprazole (PRILOSEC) 40 MG capsule  Immunization History  Administered Date(s) Administered   Hep A / Hep B 03/08/2021, 04/07/2021   Hepatitis A, Adult 09/08/2021   Hepatitis B, PED/ADOLESCENT 09/08/2021, 09/08/2021   Influenza Split 06/14/2012   Influenza Whole 06/02/2011   Influenza,inj,Quad PF,6+ Mos 07/07/2013, 07/21/2015, 05/23/2016, 05/22/2017, 08/06/2019, 11/14/2021, 05/16/2022   Moderna Sars-Covid-2 Vaccination 11/30/2019, 12/28/2019, 09/06/2020   Pneumococcal Conjugate-13 08/06/2019, 11/17/2020   Tdap 09/11/2011, 11/17/2020    Health  Maintenance  Topic Date Due   Zoster Vaccines- Shingrix (1 of 2) Never done   OPHTHALMOLOGY EXAM  09/30/2021   COLONOSCOPY (Pts 45-3yrs Insurance coverage will need to be confirmed)  02/08/2022   COVID-19 Vaccine (4 - 2023-24 season) 05/26/2022   HIV Screening  05/17/2023 (Originally 02/01/1976)   Diabetic kidney evaluation - eGFR measurement  02/23/2023   INFLUENZA VACCINE  04/26/2023   HEMOGLOBIN A1C  07/13/2023   Diabetic kidney evaluation - Urine ACR  01/11/2024   FOOT EXAM  01/11/2024   DTaP/Tdap/Td (3 - Td or Tdap) 11/17/2030   Hepatitis C Screening  Completed   HPV VACCINES  Aged Out  Discussed the fact that it might be worthwhile trying a medication called Livalo to see if we can get around the intolerance to statins.  He will let me know how he is doing on it.  Otherwise I encouraged him to continue to work on diet and exercise stating we are relatively limited because of his adverse reactions  to various other medications.  Discussed health benefits of physical activity, and encouraged him to engage in regular exercise appropriate for his age and condition.  Problem List Items Addressed This Visit     Adverse effect of sodium-glucose cotransporter 2 (SGLT2) inhibitor   Adverse reaction to antihyperlipidemic drug, initial encounter   Hyperlipidemia associated with type 2 diabetes mellitus   Relevant Medications   lisinopril-hydrochlorothiazide (ZESTORETIC) 10-12.5 MG tablet   Dulaglutide (TRULICITY) 3 MG/0.5ML SOPN   fenofibrate (TRICOR) 145 MG tablet   Pitavastatin Calcium 1 MG TABS   Other Relevant Orders   Lipid panel   Hypertension associated with diabetes   Relevant Medications   lisinopril-hydrochlorothiazide (ZESTORETIC) 10-12.5 MG tablet   Dulaglutide (TRULICITY) 3 MG/0.5ML SOPN   fenofibrate (TRICOR) 145 MG tablet   Pitavastatin Calcium 1 MG TABS   Other Relevant Orders   CBC with Differential/Platelet   Comprehensive metabolic panel   Metformin adverse  reaction, subsequent encounter   Obesity (BMI 30-39.9)   Relevant Medications   Dulaglutide (TRULICITY) 3 MG/0.5ML SOPN   Tubular adenoma of colon   Type 2 diabetes mellitus with ophthalmic complication   Relevant Medications   lisinopril-hydrochlorothiazide (ZESTORETIC) 10-12.5 MG tablet   Dulaglutide (TRULICITY) 3 MG/0.5ML SOPN   Pitavastatin Calcium 1 MG TABS   Other Relevant Orders   POCT glycosylated hemoglobin (Hb A1C) (Completed)   POCT UA - Microalbumin (Completed)   Vasculogenic erectile dysfunction   Other Visit Diagnoses     Routine general medical examination at a health care facility    -  Primary   Relevant Orders   POCT Urinalysis DIP (Proadvantage Device) (Completed)   Erectile dysfunction, unspecified erectile dysfunction type       Gastroesophageal reflux disease, unspecified whether esophagitis present       Relevant Medications   omeprazole (PRILOSEC) 40 MG capsule      Follow-up in 4 to 6 months    Sharlot Gowda, MD

## 2023-01-12 LAB — COMPREHENSIVE METABOLIC PANEL
ALT: 55 IU/L — ABNORMAL HIGH (ref 0–44)
AST: 43 IU/L — ABNORMAL HIGH (ref 0–40)
Albumin/Globulin Ratio: 1.4 (ref 1.2–2.2)
Albumin: 4.3 g/dL (ref 3.8–4.9)
Alkaline Phosphatase: 77 IU/L (ref 44–121)
BUN/Creatinine Ratio: 16 (ref 9–20)
BUN: 16 mg/dL (ref 6–24)
Bilirubin Total: 0.9 mg/dL (ref 0.0–1.2)
CO2: 20 mmol/L (ref 20–29)
Calcium: 9.2 mg/dL (ref 8.7–10.2)
Chloride: 101 mmol/L (ref 96–106)
Creatinine, Ser: 0.98 mg/dL (ref 0.76–1.27)
Globulin, Total: 3 g/dL (ref 1.5–4.5)
Glucose: 134 mg/dL — ABNORMAL HIGH (ref 70–99)
Potassium: 4.2 mmol/L (ref 3.5–5.2)
Sodium: 136 mmol/L (ref 134–144)
Total Protein: 7.3 g/dL (ref 6.0–8.5)
eGFR: 89 mL/min/{1.73_m2} (ref >59)

## 2023-01-12 LAB — CBC WITH DIFFERENTIAL/PLATELET
Basophils Absolute: 0 10*3/uL (ref 0.0–0.2)
Basos: 1 %
EOS (ABSOLUTE): 0.1 10*3/uL (ref 0.0–0.4)
Eos: 2 %
Hematocrit: 46.2 % (ref 37.5–51.0)
Hemoglobin: 16.5 g/dL (ref 13.0–17.7)
Immature Grans (Abs): 0 10*3/uL (ref 0.0–0.1)
Immature Granulocytes: 0 %
Lymphocytes Absolute: 1.4 10*3/uL (ref 0.7–3.1)
Lymphs: 26 %
MCH: 33.8 pg — ABNORMAL HIGH (ref 26.6–33.0)
MCHC: 35.7 g/dL (ref 31.5–35.7)
MCV: 95 fL (ref 79–97)
Monocytes Absolute: 0.5 10*3/uL (ref 0.1–0.9)
Monocytes: 9 %
Neutrophils Absolute: 3.2 10*3/uL (ref 1.4–7.0)
Neutrophils: 62 %
Platelets: 175 10*3/uL (ref 150–450)
RBC: 4.88 x10E6/uL (ref 4.14–5.80)
RDW: 12.5 % (ref 11.6–15.4)
WBC: 5.3 10*3/uL (ref 3.4–10.8)

## 2023-01-12 LAB — LIPID PANEL
Chol/HDL Ratio: 7.1 ratio — ABNORMAL HIGH (ref 0.0–5.0)
Cholesterol, Total: 185 mg/dL (ref 100–199)
HDL: 26 mg/dL — ABNORMAL LOW (ref >39)
LDL Chol Calc (NIH): 102 mg/dL — ABNORMAL HIGH (ref 0–99)
Triglycerides: 336 mg/dL — ABNORMAL HIGH (ref 0–149)
VLDL Cholesterol Cal: 57 mg/dL — ABNORMAL HIGH (ref 5–40)

## 2023-01-14 ENCOUNTER — Other Ambulatory Visit: Payer: Self-pay | Admitting: Family Medicine

## 2023-01-14 DIAGNOSIS — E11319 Type 2 diabetes mellitus with unspecified diabetic retinopathy without macular edema: Secondary | ICD-10-CM

## 2023-01-15 NOTE — Telephone Encounter (Signed)
Please see pharmacy comment for Trulicity prescription.   Pharmacy comment: Product Backordered/Unavailable:ON BACKORDER CONSIDER ALTERNATIVE.

## 2023-02-12 ENCOUNTER — Other Ambulatory Visit: Payer: Self-pay | Admitting: Family Medicine

## 2023-02-12 DIAGNOSIS — E781 Pure hyperglyceridemia: Secondary | ICD-10-CM

## 2023-02-12 DIAGNOSIS — E1169 Type 2 diabetes mellitus with other specified complication: Secondary | ICD-10-CM

## 2023-02-28 ENCOUNTER — Encounter: Payer: Self-pay | Admitting: Family Medicine

## 2023-03-01 ENCOUNTER — Other Ambulatory Visit: Payer: Self-pay | Admitting: Family Medicine

## 2023-03-01 DIAGNOSIS — E11319 Type 2 diabetes mellitus with unspecified diabetic retinopathy without macular edema: Secondary | ICD-10-CM

## 2023-04-06 IMAGING — US US ABDOMEN LIMITED
1 series · 14 of 25 positions shown · non-contrast
Comparison: 02/16/2021

CLINICAL DATA: Cirrhosis

EXAM:
ULTRASOUND ABDOMEN LIMITED RIGHT UPPER QUADRANT

[Series 1: us abdomen limited ruq (liver/gb) · 14 of 69 slices shown]
[im 1/69]
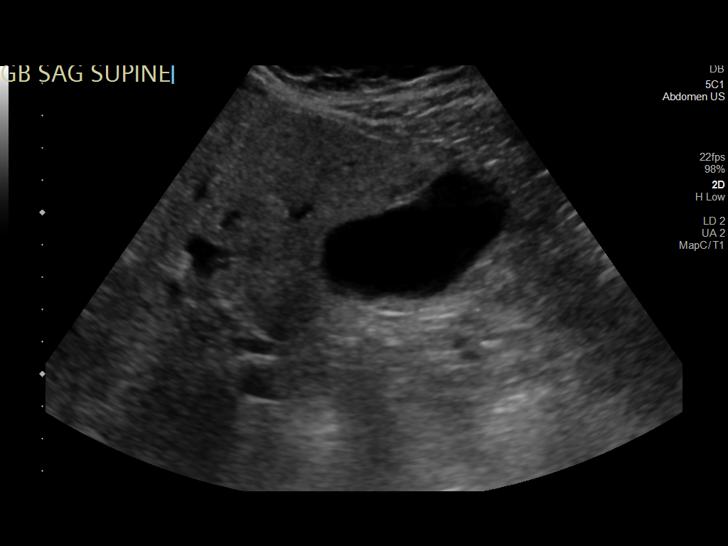
[im 6/69]
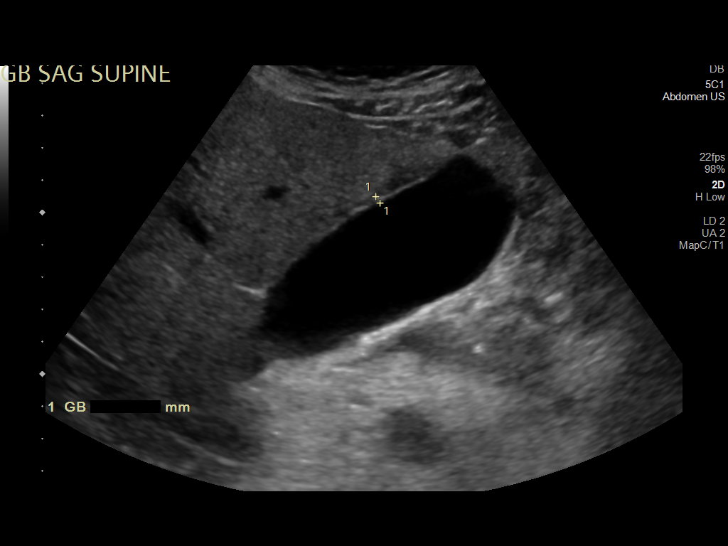
[im 12/69]
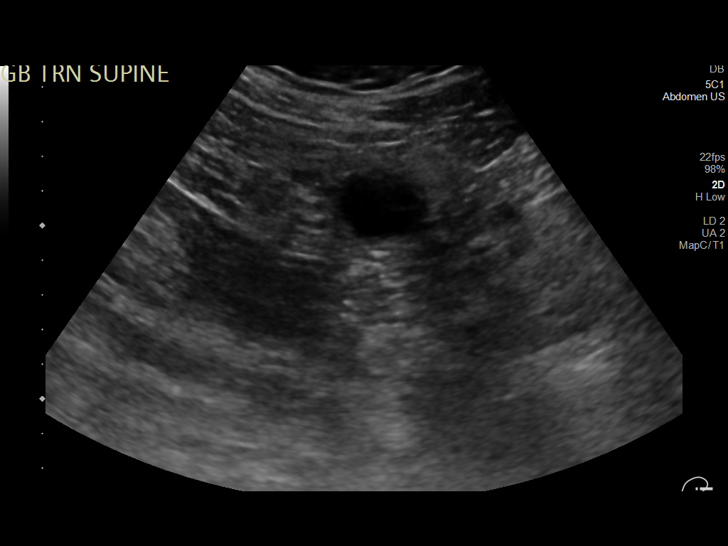
[im 18/69]
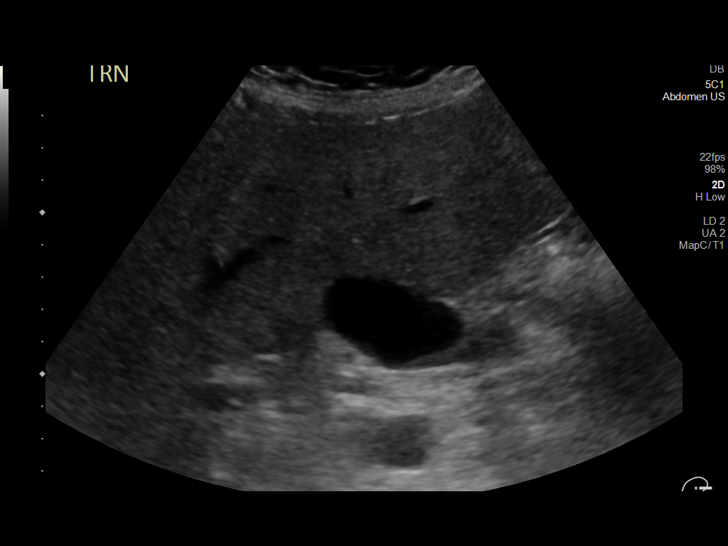
[im 23/69]
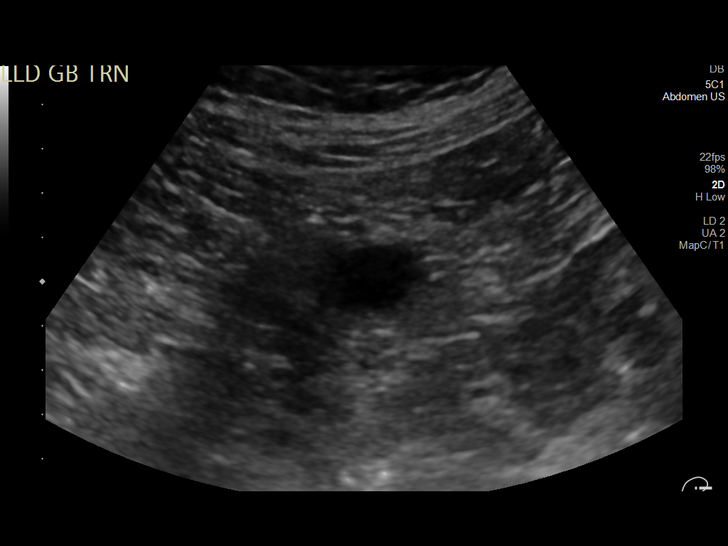
[im 26/69]
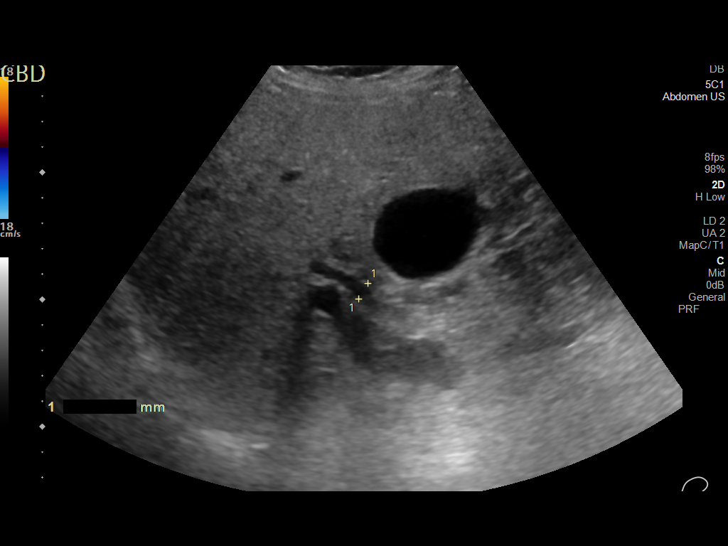
[im 32/69]
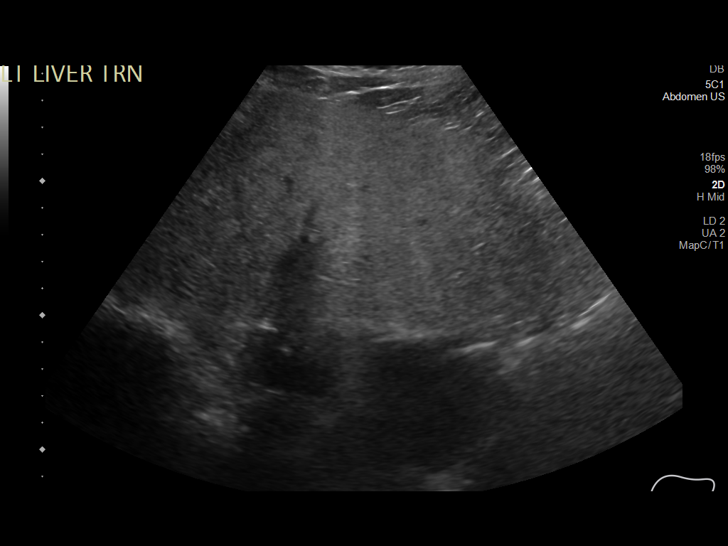
[im 37/69]
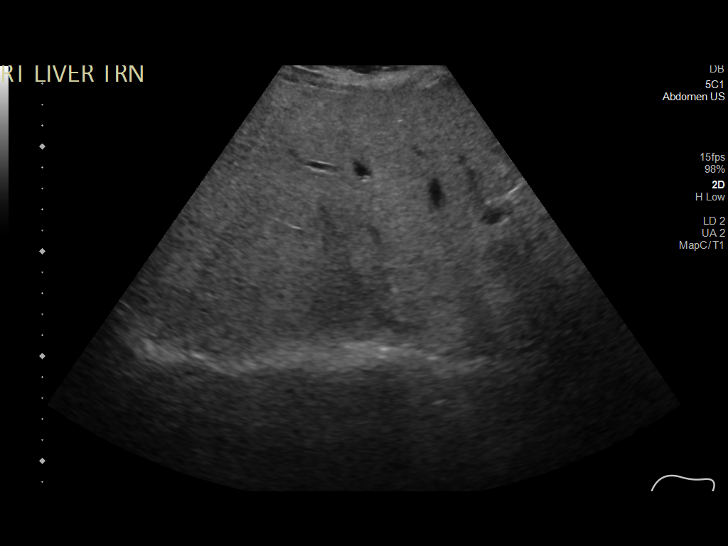
[im 43/69]
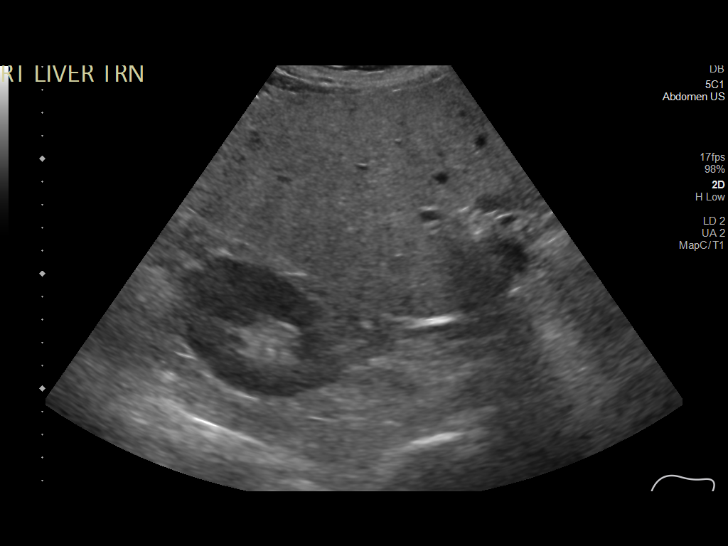
[im 46/69]
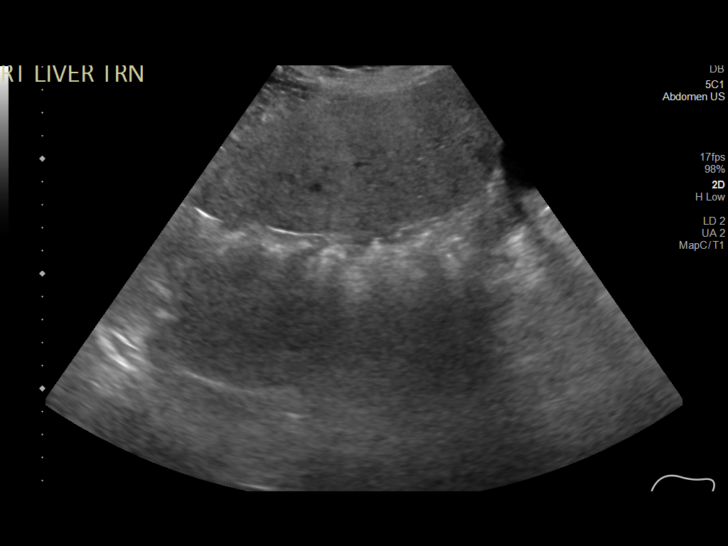
[im 52/69]
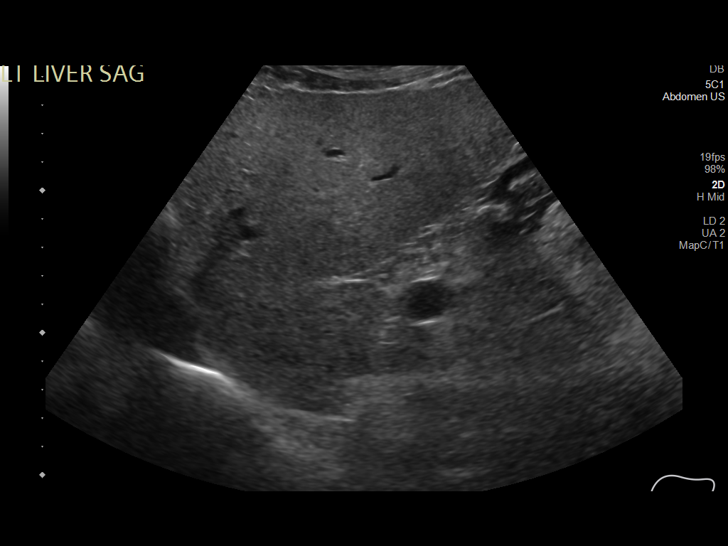
[im 57/69]
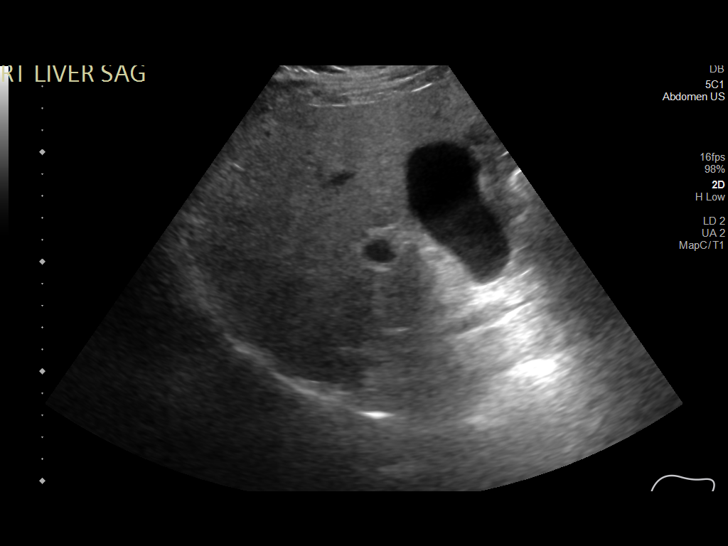
[im 63/69]
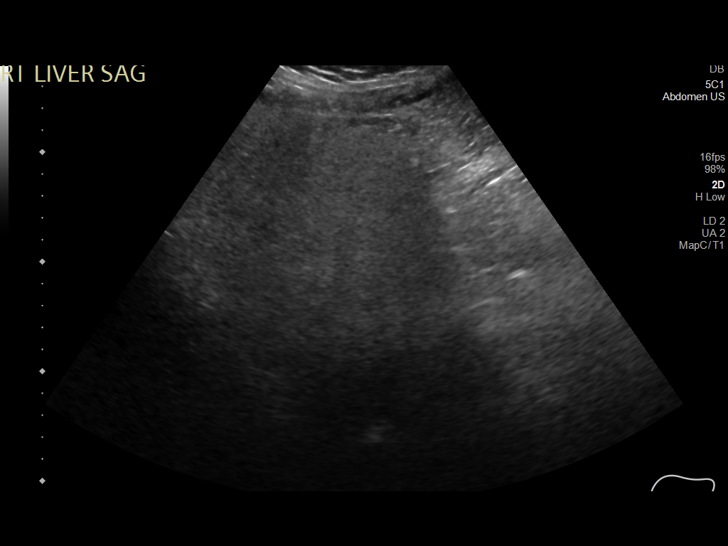
[im 69/69]
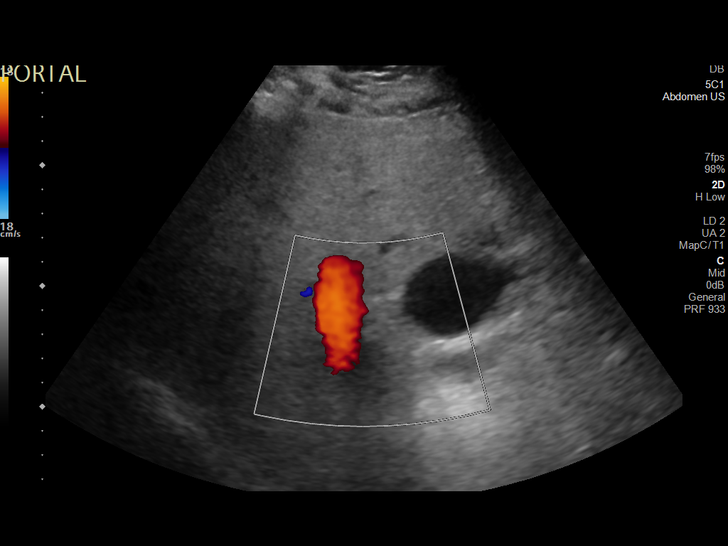

[14 of 25 positions shown; findings below may reference images not displayed]

FINDINGS: Gallbladder:

No gallstones or wall thickening visualized. No sonographic Murphy
sign noted by sonographer.

Common bile duct:

Diameter: 7 mm

Liver:

Coarsened increased liver echotexture compatible with given history
of cirrhosis. Likely background hepatic steatosis as well. No focal
parenchymal liver abnormalities. No intrahepatic duct dilation.
Portal vein is patent on color Doppler imaging with normal direction
of blood flow towards the liver.

Other: None.
IMPRESSION: 1. Coarsened increased liver echotexture consistent with likely
cirrhosis and hepatic steatosis. No focal parenchymal abnormalities.

## 2023-04-10 ENCOUNTER — Telehealth: Payer: Self-pay | Admitting: Family Medicine

## 2023-04-10 DIAGNOSIS — E11319 Type 2 diabetes mellitus with unspecified diabetic retinopathy without macular edema: Secondary | ICD-10-CM

## 2023-04-10 MED ORDER — TRULICITY 3 MG/0.5ML ~~LOC~~ SOAJ: 3.0000 mg | SUBCUTANEOUS | 0 refills | Status: DC

## 2023-04-10 NOTE — Telephone Encounter (Signed)
Pt is requesting a refill on trulicty for 3 months to  [id number] - [place], [place] - [address]

## 2023-05-17 ENCOUNTER — Other Ambulatory Visit: Payer: Self-pay | Admitting: Family Medicine

## 2023-05-17 ENCOUNTER — Other Ambulatory Visit: Payer: Self-pay | Admitting: Medical

## 2023-05-17 DIAGNOSIS — E1169 Type 2 diabetes mellitus with other specified complication: Secondary | ICD-10-CM

## 2023-05-17 DIAGNOSIS — E781 Pure hyperglyceridemia: Secondary | ICD-10-CM

## 2023-06-28 ENCOUNTER — Telehealth: Payer: Self-pay | Admitting: Family Medicine

## 2023-06-28 NOTE — Telephone Encounter (Signed)
Fax from [facility]  Trulicity 3 mg/0.5 ml pen

## 2023-06-28 NOTE — Telephone Encounter (Signed)
Called pt, no answer, left voicemail.

## 2023-07-02 ENCOUNTER — Telehealth: Payer: Self-pay | Admitting: Family Medicine

## 2023-07-02 DIAGNOSIS — E11319 Type 2 diabetes mellitus with unspecified diabetic retinopathy without macular edema: Secondary | ICD-10-CM

## 2023-07-02 NOTE — Telephone Encounter (Signed)
  Pt needs refill on Trulicity  has medcheck 10/

## 2023-07-03 MED ORDER — TRULICITY 3 MG/0.5ML ~~LOC~~ SOAJ: 3.0000 mg | SUBCUTANEOUS | 1 refills | Status: DC

## 2023-07-12 ENCOUNTER — Ambulatory Visit: Payer: 59 | Admitting: Family Medicine

## 2023-07-12 ENCOUNTER — Encounter: Payer: Self-pay | Admitting: Family Medicine

## 2023-07-12 VITALS — BP 116/78 | HR 80 | Wt 253.6 lb

## 2023-07-12 DIAGNOSIS — E1169 Type 2 diabetes mellitus with other specified complication: Secondary | ICD-10-CM

## 2023-07-12 DIAGNOSIS — E1159 Type 2 diabetes mellitus with other circulatory complications: Secondary | ICD-10-CM | POA: Diagnosis not present

## 2023-07-12 DIAGNOSIS — T466X5A Adverse effect of antihyperlipidemic and antiarteriosclerotic drugs, initial encounter: Secondary | ICD-10-CM

## 2023-07-12 DIAGNOSIS — T383X5A Adverse effect of insulin and oral hypoglycemic [antidiabetic] drugs, initial encounter: Secondary | ICD-10-CM

## 2023-07-12 DIAGNOSIS — K7460 Unspecified cirrhosis of liver: Secondary | ICD-10-CM | POA: Diagnosis not present

## 2023-07-12 DIAGNOSIS — E11311 Type 2 diabetes mellitus with unspecified diabetic retinopathy with macular edema: Secondary | ICD-10-CM | POA: Diagnosis not present

## 2023-07-12 DIAGNOSIS — E781 Pure hyperglyceridemia: Secondary | ICD-10-CM

## 2023-07-12 DIAGNOSIS — Z23 Encounter for immunization: Secondary | ICD-10-CM

## 2023-07-12 DIAGNOSIS — E669 Obesity, unspecified: Secondary | ICD-10-CM

## 2023-07-12 DIAGNOSIS — T383X5D Adverse effect of insulin and oral hypoglycemic [antidiabetic] drugs, subsequent encounter: Secondary | ICD-10-CM

## 2023-07-12 DIAGNOSIS — E785 Hyperlipidemia, unspecified: Secondary | ICD-10-CM

## 2023-07-12 DIAGNOSIS — I152 Hypertension secondary to endocrine disorders: Secondary | ICD-10-CM

## 2023-07-12 LAB — POCT GLYCOSYLATED HEMOGLOBIN (HGB A1C): Hemoglobin A1C: 7.7 % — AB (ref 4.0–5.6)

## 2023-07-12 MED ORDER — TRULICITY 4.5 MG/0.5ML ~~LOC~~ SOAJ
4.5000 mg | SUBCUTANEOUS | 1 refills | Status: DC
Start: 2023-07-12 — End: 2023-12-05

## 2023-07-12 MED ORDER — VASCEPA 1 G PO CAPS
2.0000 g | ORAL_CAPSULE | Freq: Two times a day (BID) | ORAL | 1 refills | Status: DC
Start: 2023-07-12 — End: 2024-02-12

## 2023-07-12 MED ORDER — FENOFIBRATE 145 MG PO TABS: 145.0000 mg | ORAL_TABLET | Freq: Every day | ORAL | 3 refills | Status: DC

## 2023-07-12 MED ORDER — EZETIMIBE 10 MG PO TABS: 10.0000 mg | ORAL_TABLET | Freq: Every day | ORAL | 1 refills | Status: DC

## 2023-07-12 NOTE — Progress Notes (Signed)
Subjective:    Patient ID: Trevor Johnson, male    DOB: [birth year], 62 y.o.   MRN: [id number]  HPI He is here for a recheck.  He did try Livalo but again had difficulty with myalgias and stopped taking that medication.  He continues on fenofibrate, Zetia and Vascepa.  He does occasionally check his CBGs.  Continues on Trulicity 3 mg.  He is metformin intolerant.  He does have a history of colon cancer and was last seen in 2022 for surveillance.  He is not sure when he has another appointment scheduled with him.  He is also followed by GI for his cirrhosis.  He does not drink.   Review of Systems     Objective:    Physical Exam Alert and in no distress otherwise not examined Hemoglobin A1c is 7.7       Assessment & Plan:   Problem List Items Addressed This Visit     Adverse effect of sodium-glucose cotransporter 2 (SGLT2) inhibitor   Adverse reaction to antihyperlipidemic drug, initial encounter   Cirrhosis of liver without ascites (HCC)   Hyperlipidemia associated with type 2 diabetes mellitus (HCC)   Relevant Medications   fenofibrate (TRICOR) 145 MG tablet   ezetimibe (ZETIA) 10 MG tablet   VASCEPA 1 g capsule   Dulaglutide (TRULICITY) 4.5 MG/0.5ML SOPN   Hypertension associated with diabetes (HCC)   Relevant Medications   fenofibrate (TRICOR) 145 MG tablet   ezetimibe (ZETIA) 10 MG tablet   VASCEPA 1 g capsule   Dulaglutide (TRULICITY) 4.5 MG/0.5ML SOPN   Metformin adverse reaction, subsequent encounter   Obesity (BMI 30-39.9)   Relevant Medications   Dulaglutide (TRULICITY) 4.5 MG/0.5ML SOPN   Type 2 diabetes mellitus with ophthalmic complication (HCC) - Primary   Relevant Medications   Dulaglutide (TRULICITY) 4.5 MG/0.5ML SOPN   Other Relevant Orders   POCT glycosylated hemoglobin (Hb A1C)   Other Visit Diagnoses     Hypertriglyceridemia       Relevant Medications   fenofibrate (TRICOR) 145 MG tablet   ezetimibe (ZETIA) 10 MG tablet   VASCEPA 1 g capsule    Need for shingles vaccine       Relevant Orders   Zoster Recombinant (Shingrix )   Need for influenza vaccination       Relevant Orders   Flu vaccine trivalent PF, 6mos and older(Flulaval,Afluria,Fluarix,Fluzone)     Recommend that he call gastroenterology and find out when the next appointment is scheduled for surveillance of his colon cancer and also the cirrhosis.  Discussed increasing his medication versus adding something else for the diabetes however at this point we are stuck with only having GLP and insulin.  I will increase his Trulicity and hope we do not need to take the next steps that she has had adverse reactions from multiple medications.  Recheck here in 4 months.

## 2023-07-16 ENCOUNTER — Encounter: Payer: Self-pay | Admitting: Gastroenterology

## 2023-07-31 ENCOUNTER — Telehealth: Payer: Self-pay

## 2023-07-31 NOTE — Telephone Encounter (Signed)
Faxed request for sildenafil. Last appt. 07/12/23. Next appt. 11/14/23.

## 2023-08-01 NOTE — Telephone Encounter (Signed)
Pt states [facility] is having 50% off but he is going to look around.

## 2023-08-16 ENCOUNTER — Ambulatory Visit: Payer: 59 | Admitting: *Deleted

## 2023-08-16 VITALS — Ht 71.5 in | Wt 274.0 lb

## 2023-08-16 DIAGNOSIS — Z85038 Personal history of other malignant neoplasm of large intestine: Secondary | ICD-10-CM

## 2023-08-16 DIAGNOSIS — Z860100 Personal history of colon polyps, unspecified: Secondary | ICD-10-CM

## 2023-08-16 MED ORDER — NA SULFATE-K SULFATE-MG SULF 17.5-3.13-1.6 GM/177ML PO SOLN
1.0000 | Freq: Once | ORAL | 0 refills | Status: AC
Start: 2023-08-16 — End: 2023-08-16

## 2023-08-16 NOTE — Progress Notes (Signed)
Pt's name and DOB verified at the beginning of the pre-visit wit 2 identifiers  Pt denies any difficulty with ambulating,sitting, laying down or rolling side to side  Pt has issues with ambulation   Pt has no issues moving head neck or swallowing  No egg or soy allergy known to patient   No issues known to pt with past sedation with any surgeries or procedures  Pt denies having issues being intubated  No FH of Malignant Hyperthermia  Pt is not on diet pills or shots  Pt is not on home 02   Pt is not on blood thinners    Pt denies issues with constipation    Pt is not on dialysis  Pt denise any abnormal heart rhythms   Pt denies any upcoming cardiac testing  Pt encouraged to use to use Singlecare or Goodrx to reduce cost   Patient's chart reviewed by Cathlyn Parsons CNRA prior to pre-visit and patient appropriate for the [facility].  Pre-visit completed and red dot placed by patient's name on their procedure day (on provider's schedule).  .  Visit in person   Pt states weight is 274 lb   Instructed pt why it is important to and  to call if they have any changes in health or new medications. Directed them to the # given and on instructions.     Instructions reviewed. Pt given both [facility] main # and MD on call # prior to instructions.  Pt states understanding. Instructed to review again prior to procedure. Pt states they will.  Instructions and coupon given to pt   Instructions sent through My Chart

## 2023-08-20 ENCOUNTER — Encounter: Payer: Self-pay | Admitting: Gastroenterology

## 2023-09-07 ENCOUNTER — Ambulatory Visit (AMBULATORY_SURGERY_CENTER): Payer: 59 | Admitting: Gastroenterology

## 2023-09-07 ENCOUNTER — Encounter: Payer: Self-pay | Admitting: Gastroenterology

## 2023-09-07 VITALS — BP 119/74 | HR 75 | Temp 97.2°F | Resp 15 | Ht 71.0 in | Wt 274.0 lb

## 2023-09-07 DIAGNOSIS — D124 Benign neoplasm of descending colon: Secondary | ICD-10-CM | POA: Diagnosis not present

## 2023-09-07 DIAGNOSIS — D122 Benign neoplasm of ascending colon: Secondary | ICD-10-CM

## 2023-09-07 DIAGNOSIS — K5730 Diverticulosis of large intestine without perforation or abscess without bleeding: Secondary | ICD-10-CM

## 2023-09-07 DIAGNOSIS — K648 Other hemorrhoids: Secondary | ICD-10-CM | POA: Diagnosis not present

## 2023-09-07 DIAGNOSIS — D123 Benign neoplasm of transverse colon: Secondary | ICD-10-CM

## 2023-09-07 DIAGNOSIS — Z860100 Personal history of colon polyps, unspecified: Secondary | ICD-10-CM

## 2023-09-07 DIAGNOSIS — Z1211 Encounter for screening for malignant neoplasm of colon: Secondary | ICD-10-CM | POA: Diagnosis present

## 2023-09-07 DIAGNOSIS — D125 Benign neoplasm of sigmoid colon: Secondary | ICD-10-CM | POA: Diagnosis not present

## 2023-09-07 DIAGNOSIS — D120 Benign neoplasm of cecum: Secondary | ICD-10-CM

## 2023-09-07 DIAGNOSIS — Z85038 Personal history of other malignant neoplasm of large intestine: Secondary | ICD-10-CM

## 2023-09-07 MED ORDER — SODIUM CHLORIDE 0.9 % IV SOLN: 500.0000 mL | Freq: Once | INTRAVENOUS | Status: AC

## 2023-09-07 NOTE — Progress Notes (Signed)
[facility] History and Physical   Primary Care Physician:  Trevor Nian, MD   Reason for Procedure:   History of colon polyps, history of malignant colon polyp  Plan:    colonoscopy     HPI: Trevor Johnson is a 62 y.o. male  here for colonoscopy surveillance - malignant colon polyp removed 01/2021, follow up flex sig [id number] showed no recurrence. He declined surgery. Overdue for colonoscopy.   Patient denies any bowel symptoms at this time.  Otherwise feels well without any cardiopulmonary symptoms.   I have discussed risks / benefits of anesthesia and endoscopic procedure with Trevor Johnson and they wish to proceed with the exams as outlined today.    Past Medical History:  Diagnosis Date   Allergy    RHINITIS   Cirrhosis of liver (HCC) 02/2021   Colon cancer (HCC) 01/2021   malignant colon polyp (sigmoid colon)   Colon cancer (HCC) 02/22/2022   Diabetes mellitus    Fatty liver    Hypertension    Hypertriglyceridemia    Obesity    Renal stone     Past Surgical History:  Procedure Laterality Date   COLONOSCOPY     URETHRAL STRICTURE DILATATION      Prior to Admission medications   Medication Sig Start Date End Date Taking? Authorizing Provider  ezetimibe (ZETIA) 10 MG tablet Take 1 tablet (10 mg total) by mouth daily. 07/12/23  Yes Trevor Nian, MD  fenofibrate (TRICOR) 145 MG tablet Take 1 tablet (145 mg total) by mouth daily. 07/12/23  Yes Trevor Nian, MD  lisinopril-hydrochlorothiazide (ZESTORETIC) 10-12.5 MG tablet Take 1 tablet by mouth daily. 01/11/23  Yes Trevor Nian, MD  omeprazole (PRILOSEC) 40 MG capsule Take 1 capsule (40 mg total) by mouth in the morning and at bedtime. 01/11/23  Yes Trevor Nian, MD  VASCEPA 1 g capsule Take 2 capsules (2 g total) by mouth 2 (two) times daily. 07/12/23  Yes Trevor Nian, MD  Dulaglutide (TRULICITY) 4.5 MG/0.5ML SOPN Inject 4.5 mg as directed once a week. 07/12/23   Trevor Nian, MD   sildenafil (REVATIO) 20 MG tablet TAKE UP TO 5 TABLETS BY MOUTH ONCE DAILY AS NEEDED 10/26/22   Trevor Nian, MD    Current Outpatient Medications  Medication Sig Dispense Refill   ezetimibe (ZETIA) 10 MG tablet Take 1 tablet (10 mg total) by mouth daily. 90 tablet 1   fenofibrate (TRICOR) 145 MG tablet Take 1 tablet (145 mg total) by mouth daily. 90 tablet 3   lisinopril-hydrochlorothiazide (ZESTORETIC) 10-12.5 MG tablet Take 1 tablet by mouth daily. 90 tablet 3   omeprazole (PRILOSEC) 40 MG capsule Take 1 capsule (40 mg total) by mouth in the morning and at bedtime. 180 capsule 1   VASCEPA 1 g capsule Take 2 capsules (2 g total) by mouth 2 (two) times daily. 360 capsule 1   Dulaglutide (TRULICITY) 4.5 MG/0.5ML SOPN Inject 4.5 mg as directed once a week. 6 mL 1   sildenafil (REVATIO) 20 MG tablet TAKE UP TO 5 TABLETS BY MOUTH ONCE DAILY AS NEEDED 50 tablet 3   Current Facility-Administered Medications  Medication Dose Route Frequency Provider Last Rate Last Admin   0.9 %  sodium chloride infusion  500 mL Intravenous Once Trevor Johnson, Willaim Rayas, MD        Allergies as of 09/07/2023 - Review Complete 09/07/2023  Allergen Reaction Noted   Lipitor [atorvastatin] Itching 05/01/2014   Crestor [employer]  calcium] Rash 11/15/2015    Family History  Problem Relation Age of Onset   Colon cancer Maternal Grandfather    Colon polyps Neg Hx    Esophageal cancer Neg Hx    Rectal cancer Neg Hx    Stomach cancer Neg Hx     Social History   Socioeconomic History   Marital status: Married    Spouse name: Not on file   Number of children: Not on file   Years of education: Not on file   Highest education level: Not on file  Occupational History   Not on file  Tobacco Use   Smoking status: Never   Smokeless tobacco: Never  Vaping Use   Vaping status: Never Used  Substance and Sexual Activity   Alcohol use: No   Drug use: No   Sexual activity: Yes    Birth control/protection:  Surgical  Other Topics Concern   Not on file  Social History Narrative   Not on file   Social Drivers of Health   Financial Resource Strain: Low Risk  (07/12/2023)   Overall Financial Resource Strain (CARDIA)    Difficulty of Paying Living Expenses: Not hard at all  Food Insecurity: No Food Insecurity (07/12/2023)   Hunger Vital Sign    Worried About Running Out of Food in the Last Year: Never true    Ran Out of Food in the Last Year: Never true  Transportation Needs: No Transportation Needs (07/12/2023)   PRAPARE - Administrator, Civil Service (Medical): No    Lack of Transportation (Non-Medical): No  Physical Activity: Inactive (07/12/2023)   Exercise Vital Sign    Days of Exercise per Week: 0 days    Minutes of Exercise per Session: 0 min  Stress: No Stress Concern Present (07/12/2023)   [employer] of Occupational Health - Occupational Stress Questionnaire    Feeling of Stress : Not at all  Social Connections: Socially Integrated (07/12/2023)   Social Connection and Isolation Panel [NHANES]    Frequency of Communication with Friends and Family: More than three times a week    Frequency of Social Gatherings with Friends and Family: More than three times a week    Attends Religious Services: More than 4 times per year    Active Member of [employer] or Organizations: Yes    Attends Banker Meetings: 1 to 4 times per year    Marital Status: Married  Catering manager Violence: Not At Risk (07/12/2023)   Humiliation, Afraid, Rape, and Kick questionnaire    Fear of Current or Ex-Partner: No    Emotionally Abused: No    Physically Abused: No    Sexually Abused: No    Review of Systems: All other review of systems negative except as mentioned in the HPI.  Physical Exam: Vital signs BP 139/73   Pulse 90   Temp (!) 97.2 F (36.2 C)   Ht 5\' 11"  (1.803 m)   Wt 274 lb (124.3 kg)   SpO2 96%   BMI 38.22 kg/m   General:   Alert,  Well-developed,  pleasant and cooperative in NAD Lungs:  Clear throughout to auscultation.   Heart:  Regular rate and rhythm Abdomen:  Soft, nontender and nondistended.   Neuro/Psych:  Alert and cooperative. Normal mood and affect. A and O x 3  Trevor Rain, MD [facility]

## 2023-09-07 NOTE — Patient Instructions (Addendum)
Recommendation:  - Patient has a contact number available for emergencies. The signs and symptoms of potential delayed complications were discussed with the patient. Return to normal activities tomorrow. Written discharge instructions were provided to the patient.  - Resume previous diet.  - Continue present medications.  - Await pathology results.  - Genetic counseling consultation if not already done, will discuss with the patient  YOU HAD AN ENDOSCOPIC PROCEDURE TODAY AT THE [facility]:   Refer to the procedure report that was given to you for any specific questions about what was found during the examination.  If the procedure report does not answer your questions, please call your gastroenterologist to clarify.  If you requested that your care partner not be given the details of your procedure findings, then the procedure report has been included in a sealed envelope for you to review at your convenience later.  YOU SHOULD EXPECT: Some feelings of bloating in the abdomen. Passage of more gas than usual.  Walking can help get rid of the air that was put into your GI tract during the procedure and reduce the bloating. If you had a lower endoscopy (such as a colonoscopy or flexible sigmoidoscopy) you may notice spotting of blood in your stool or on the toilet paper. If you underwent a bowel prep for your procedure, you may not have a normal bowel movement for a few days.  Please Note:  You might notice some irritation and congestion in your nose or some drainage.  This is from the oxygen used during your procedure.  There is no need for concern and it should clear up in a day or so.  SYMPTOMS TO REPORT IMMEDIATELY:  Following lower endoscopy (colonoscopy or flexible sigmoidoscopy):  Excessive amounts of blood in the stool  Significant tenderness or worsening of abdominal pains  Swelling of the abdomen that is new, acute  Fever of 100F or higher    For urgent or emergent  issues, a gastroenterologist can be reached at any hour by calling [phone]. Do not use MyChart messaging for urgent concerns.    DIET:  We do recommend a small meal at first, but then you may proceed to your regular diet.  Drink plenty of fluids but you should avoid alcoholic beverages for 24 hours.  MEDICATIONS: Continue present medications.  Please see handouts given to you by your recovery nurse:  FOLLOW UP: Await pathology results. Genetic counseling consultation if not already done, Dr. Adela Lank has discussed this with patient.  Thank you for allowing [place] to provide for your healthcare needs today.  ACTIVITY:  You should plan to take it easy for the rest of today and you should NOT DRIVE or use heavy machinery until tomorrow (because of the sedation medicines used during the test).    FOLLOW UP: Our staff will call the number listed on your records the next business day following your procedure.  We will call around 7:15- 8:00 am to check on you and address any questions or concerns that you may have regarding the information given to you following your procedure. If we do not reach you, we will leave a message.     If any biopsies were taken you will be contacted by phone or by letter within the next 1-3 weeks.  Please call us at [phone] if you have not heard about the biopsies in 3 weeks.    SIGNATURES/CONFIDENTIALITY: You and/or your care partner have signed paperwork which will be entered into  your electronic medical record.  These signatures attest to the fact that that the information above on your After Visit Summary has been reviewed and is understood.  Full responsibility of the confidentiality of this discharge information lies with you and/or your care-partner.

## 2023-09-07 NOTE — Progress Notes (Signed)
Pt's states no medical or surgical changes since previsit or office visit. 

## 2023-09-07 NOTE — Progress Notes (Signed)
Sedate, gd SR, tolerated procedure well, VSS, report to RN 

## 2023-09-07 NOTE — Op Note (Signed)
[facility] Patient Name: Trevor Johnson Procedure Date: 09/07/2023 8:37 AM MRN: [id number] Endoscopist: Viviann Spare P. Adela Lank , MD, [id number] Age: 62 Referring MD:  Date of Birth: [birth year] Gender: Male Account #: [id number] Procedure:                Colonoscopy Indications:              High risk colon cancer surveillance: Personal                            history of colonic polyps and malignant colon polyp                            - 01/2021 Medicines:                Monitored Anesthesia Care Procedure:                Pre-Anesthesia Assessment:                           - Prior to the procedure, a History and Physical                            was performed, and patient medications and                            allergies were reviewed. The patient's tolerance of                            previous anesthesia was also reviewed. The risks                            and benefits of the procedure and the sedation                            options and risks were discussed with the patient.                            All questions were answered, and informed consent                            was obtained. Prior Anticoagulants: The patient has                            taken no anticoagulant or antiplatelet agents. ASA                            Grade Assessment: II - A patient with mild systemic                            disease. After reviewing the risks and benefits,                            the patient was deemed in satisfactory condition to  undergo the procedure.                           After obtaining informed consent, the colonoscope                            was passed under direct vision. Throughout the                            procedure, the patient's blood pressure, pulse, and                            oxygen saturations were monitored continuously. The                            Olympus Scope SN: [id number] was introduced  through                            the anus and advanced to the the cecum, identified                            by appendiceal orifice and ileocecal valve. The                            colonoscopy was performed without difficulty. The                            patient tolerated the procedure well. The quality                            of the bowel preparation was adequate. The                            ileocecal valve, appendiceal orifice, and rectum                            were photographed. Scope In: 8:41:24 AM Scope Out: 9:11:37 AM Scope Withdrawal Time: 0 hours 25 minutes 16 seconds  Total Procedure Duration: 0 hours 30 minutes 13 seconds  Findings:                 The perianal and digital rectal examinations were                            normal.                           Two sessile polyps were found in the cecum. The                            polyps were 2 to 4 mm in size. These polyps were                            removed with a cold snare. Resection and retrieval  were complete.                           A 3 mm polyp was found in the ascending colon. The                            polyp was sessile. The polyp was removed with a                            cold snare. Resection and retrieval were complete.                           A 3 mm polyp was found in the transverse colon. The                            polyp was sessile. The polyp was removed with a                            cold snare. Resection and retrieval were complete.                           A 4 mm polyp was found in the splenic flexure. The                            polyp was sessile. The polyp was removed with a                            cold snare. Resection and retrieval were complete.                           Two sessile polyps were found in the descending                            colon. The polyps were 4 mm in size. These polyps                            were  removed with a cold snare. Resection and                            retrieval were complete.                           Two sessile polyps were found in the sigmoid colon.                            The polyps were 3 mm in size. These polyps were                            removed with a cold snare. Resection and retrieval                            were complete.  A post polypectomy scar was found in the sigmoid                            colon. The scar tissue was healthy in appearance.                           Multiple small-mouthed diverticula were found in                            the left colon.                           Internal hemorrhoids were found during retroflexion.                           The exam was otherwise without abnormality. Complications:            No immediate complications. Estimated blood loss:                            Minimal. Estimated Blood Loss:     Estimated blood loss was minimal. Impression:               - Two 2 to 4 mm polyps in the cecum, removed with a                            cold snare. Resected and retrieved.                           - One 3 mm polyp in the ascending colon, removed                            with a cold snare. Resected and retrieved.                           - One 3 mm polyp in the transverse colon, removed                            with a cold snare. Resected and retrieved.                           - One 4 mm polyp at the splenic flexure, removed                            with a cold snare. Resected and retrieved.                           - Two 4 mm polyps in the descending colon, removed                            with a cold snare. Resected and retrieved.                           - Two 3 mm polyps in  the sigmoid colon, removed                            with a cold snare. Resected and retrieved.                           - Post-polypectomy scar in the sigmoid colon.                            - Diverticulosis in the left colon.                           - Internal hemorrhoids.                           - The examination was otherwise normal. Recommendation:           - Patient has a contact number available for                            emergencies. The signs and symptoms of potential                            delayed complications were discussed with the                            patient. Return to normal activities tomorrow.                            Written discharge instructions were provided to the                            patient.                           - Resume previous diet.                           - Continue present medications.                           - Await pathology results.                           - Genetic counseling consultation if not already                            done, will discuss with the patient Willaim Rayas. Kamuela Magos, MD 09/07/2023 9:21:47 AM This report has been signed electronically.

## 2023-09-10 ENCOUNTER — Telehealth: Payer: Self-pay

## 2023-09-10 NOTE — Telephone Encounter (Signed)
  Follow up Call-     09/07/2023    7:41 AM 10/24/2021    9:38 AM 06/28/2021   10:10 AM 02/08/2021    9:56 AM  Call back number  Post procedure Call Back phone  # [phone]  Permission to leave phone message Yes Yes Yes Yes     Patient questions:  Do you have a fever, pain , or abdominal swelling? No. Pain Score  0 *  Have you tolerated food without any problems? Yes.    Have you been able to return to your normal activities? Yes.    Do you have any questions about your discharge instructions: Diet   No. Medications  No. Follow up visit  No.  Do you have questions or concerns about your Care? No.  Actions: * If pain score is 4 or above: No action needed, pain <4.

## 2023-09-11 LAB — SURGICAL PATHOLOGY

## 2023-09-12 ENCOUNTER — Other Ambulatory Visit: Payer: Self-pay | Admitting: *Deleted

## 2023-09-12 DIAGNOSIS — Z85038 Personal history of other malignant neoplasm of large intestine: Secondary | ICD-10-CM

## 2023-09-12 DIAGNOSIS — D126 Benign neoplasm of colon, unspecified: Secondary | ICD-10-CM

## 2023-09-17 ENCOUNTER — Other Ambulatory Visit: Payer: Self-pay

## 2023-09-17 ENCOUNTER — Telehealth: Payer: Self-pay | Admitting: Family Medicine

## 2023-09-17 DIAGNOSIS — E1159 Type 2 diabetes mellitus with other circulatory complications: Secondary | ICD-10-CM

## 2023-09-17 DIAGNOSIS — E1169 Type 2 diabetes mellitus with other specified complication: Secondary | ICD-10-CM

## 2023-09-17 MED ORDER — LISINOPRIL-HYDROCHLOROTHIAZIDE 10-12.5 MG PO TABS: 1.0000 | ORAL_TABLET | Freq: Every day | ORAL | 0 refills | Status: DC

## 2023-09-17 NOTE — Telephone Encounter (Signed)
Lisinopril hydrochlorothiazide 10-12.5 mg #90   FYI  Spoke to pharmacy since pt should have refill. Pharmacist states he would " be short" only get 78 pills next refill so this is a proactive refill for February to have on file

## 2023-09-18 ENCOUNTER — Other Ambulatory Visit: Payer: Self-pay | Admitting: Family Medicine

## 2023-09-18 DIAGNOSIS — K219 Gastro-esophageal reflux disease without esophagitis: Secondary | ICD-10-CM

## 2023-09-24 ENCOUNTER — Telehealth: Payer: Self-pay | Admitting: Genetic Counselor

## 2023-09-24 NOTE — Telephone Encounter (Signed)
Called and left voice mail with appointment details. Patient will be sent an appointment reminder.

## 2023-11-14 ENCOUNTER — Encounter: Payer: 59 | Admitting: Family Medicine

## 2023-11-16 ENCOUNTER — Telehealth: Payer: Self-pay | Admitting: Gastroenterology

## 2023-11-16 NOTE — Telephone Encounter (Signed)
 Courteous notification of patient calling about genetic referral appointment 11/19/2023 10am. Advised  has been exposed to COVID at work. Offered mychart for consultation and if wanted to proceed with testing could schedule labs after he is cleared from COVID. Stated he is not good with online visits and rather reschedule. Next available he rescheduled for is 01/21/2024 10am. Thank you

## 2023-11-19 ENCOUNTER — Other Ambulatory Visit: Payer: 59

## 2023-11-19 ENCOUNTER — Encounter: Payer: 59 | Admitting: Genetic Counselor

## 2023-11-20 ENCOUNTER — Encounter: Payer: Self-pay | Admitting: Internal Medicine

## 2023-12-05 ENCOUNTER — Ambulatory Visit (INDEPENDENT_AMBULATORY_CARE_PROVIDER_SITE_OTHER): Payer: 59 | Admitting: Family Medicine

## 2023-12-05 ENCOUNTER — Encounter: Payer: Self-pay | Admitting: Family Medicine

## 2023-12-05 VITALS — BP 120/72 | HR 86 | Wt 242.8 lb

## 2023-12-05 DIAGNOSIS — E1169 Type 2 diabetes mellitus with other specified complication: Secondary | ICD-10-CM | POA: Diagnosis not present

## 2023-12-05 DIAGNOSIS — Z23 Encounter for immunization: Secondary | ICD-10-CM

## 2023-12-05 DIAGNOSIS — I152 Hypertension secondary to endocrine disorders: Secondary | ICD-10-CM

## 2023-12-05 DIAGNOSIS — E11311 Type 2 diabetes mellitus with unspecified diabetic retinopathy with macular edema: Secondary | ICD-10-CM

## 2023-12-05 DIAGNOSIS — E1159 Type 2 diabetes mellitus with other circulatory complications: Secondary | ICD-10-CM | POA: Diagnosis not present

## 2023-12-05 DIAGNOSIS — C189 Malignant neoplasm of colon, unspecified: Secondary | ICD-10-CM | POA: Diagnosis not present

## 2023-12-05 DIAGNOSIS — T383X5D Adverse effect of insulin and oral hypoglycemic [antidiabetic] drugs, subsequent encounter: Secondary | ICD-10-CM

## 2023-12-05 DIAGNOSIS — K7460 Unspecified cirrhosis of liver: Secondary | ICD-10-CM

## 2023-12-05 DIAGNOSIS — E669 Obesity, unspecified: Secondary | ICD-10-CM

## 2023-12-05 DIAGNOSIS — E785 Hyperlipidemia, unspecified: Secondary | ICD-10-CM | POA: Diagnosis not present

## 2023-12-05 LAB — POCT GLYCOSYLATED HEMOGLOBIN (HGB A1C): Hemoglobin A1C: 9.9 % — AB (ref 4.0–5.6)

## 2023-12-05 MED ORDER — TIRZEPATIDE 5 MG/0.5ML ~~LOC~~ SOAJ: 5.0000 mg | SUBCUTANEOUS | 0 refills | Status: DC

## 2023-12-05 MED ORDER — DAPAGLIFLOZIN PROPANEDIOL 5 MG PO TABS: 5.0000 mg | ORAL_TABLET | Freq: Every day | ORAL | Status: DC

## 2023-12-05 NOTE — Progress Notes (Signed)
   Subjective:    Patient ID: Trevor Johnson, male    DOB: [birth year], 63 y.o.   MRN: [id number]  HPI He is here for a follow-up visit.  He has made some diet and exercise changes and has lost over 30 pounds.  He continues on maximum dosing of Trulicity.  He did try Jardiance in the past but had difficulty with it causing unacceptable side effects.  He continues on Prilosec and is having no difficulty with that.  He is also taking Zetia and Tricor as well as Vascepa.  Continues on Zestoretic.  He follows up regularly with gastroenterology.  He has also had a recent colonoscopy which 1 tubular adenoma.  He will continue to be followed by gastroenterology due to this and his underlying colon cancer.   Review of Systems     Objective:    Physical Exam Alert and in no distress.  Hemoglobin A1c is 9.9.       Assessment & Plan:  Hyperlipidemia associated with type 2 diabetes mellitus (HCC) - Plan: HgB A1c  Cirrhosis of liver without ascites, unspecified hepatic cirrhosis type (HCC)  Malignant neoplasm of colon, unspecified part of colon (HCC)  Hypertension associated with diabetes (HCC)  Obesity (BMI 30-39.9)  Metformin adverse reaction, subsequent encounter  After discussion with him.  I gave him samples of Marcelline Deist and he will let me know if he has any problems with that.  I will also switch him to [facility] starting him at 5 mg.  I will increase him relatively rapidly since his A1c is elevated.  Hopefully this will give better control of his diabetes being on 2 medications.  Discussed the possibility of putting him on insulin if this does not work.  He will otherwise continue on his other medications.  He was comfortable with that and plans to call me in 1 week to let me know about the [place] and in roughly 1 month about the [facility].

## 2023-12-10 ENCOUNTER — Encounter: Payer: Self-pay | Admitting: Family Medicine

## 2023-12-10 DIAGNOSIS — E11311 Type 2 diabetes mellitus with unspecified diabetic retinopathy with macular edema: Secondary | ICD-10-CM

## 2023-12-11 MED ORDER — DAPAGLIFLOZIN PROPANEDIOL 5 MG PO TABS: 5.0000 mg | ORAL_TABLET | Freq: Every day | ORAL | 1 refills | Status: DC

## 2023-12-19 ENCOUNTER — Other Ambulatory Visit: Payer: Self-pay | Admitting: Family Medicine

## 2023-12-19 DIAGNOSIS — K219 Gastro-esophageal reflux disease without esophagitis: Secondary | ICD-10-CM

## 2023-12-23 ENCOUNTER — Encounter: Payer: Self-pay | Admitting: Family Medicine

## 2023-12-23 DIAGNOSIS — E11311 Type 2 diabetes mellitus with unspecified diabetic retinopathy with macular edema: Secondary | ICD-10-CM

## 2023-12-24 ENCOUNTER — Other Ambulatory Visit: Payer: Self-pay | Admitting: Family Medicine

## 2023-12-24 DIAGNOSIS — E11311 Type 2 diabetes mellitus with unspecified diabetic retinopathy with macular edema: Secondary | ICD-10-CM

## 2023-12-24 MED ORDER — TIRZEPATIDE 10 MG/0.5ML ~~LOC~~ SOAJ: 10.0000 mg | SUBCUTANEOUS | 1 refills | Status: DC

## 2024-01-16 ENCOUNTER — Telehealth: Payer: Self-pay | Admitting: Genetic Counselor

## 2024-01-16 NOTE — Telephone Encounter (Signed)
 Trevor Johnson called in to cancel Trevor Johnson's appointments. I informed Trevor Johnson that she is not listed on [name]'s DPR to be able to cancel his appointment. I advised Trevor Johnson to have [name] call us  to cancel his appointments.

## 2024-01-21 ENCOUNTER — Telehealth: Payer: Self-pay | Admitting: Genetic Counselor

## 2024-01-21 ENCOUNTER — Encounter: Payer: Self-pay | Admitting: Genetic Counselor

## 2024-01-21 ENCOUNTER — Inpatient Hospital Stay: Payer: 59 | Admitting: Genetic Counselor

## 2024-01-21 ENCOUNTER — Inpatient Hospital Stay: Payer: 59 | Attending: Genetic Counselor

## 2024-01-21 NOTE — Telephone Encounter (Signed)
 Received message that patient had arrived at 9:40 for his 10 am appointment, but was left waiting upstairs for 50 minutes and eventually had to leave to get back to work. F/u with him by phone, he shared that he checked in at the front desk and was sent to wait for the registration window but was never called back for appointment. Patient waited 50 minutes and reapproached desk, was told that he had been no-showed, had to leave for work. I was not notified that patient was here at any point.   Apologized and offered to reschedule patient at his convenience. He will check schedule and get back in touch with me.

## 2024-01-22 ENCOUNTER — Other Ambulatory Visit: Payer: Self-pay

## 2024-01-22 ENCOUNTER — Encounter: Payer: 59 | Admitting: Family Medicine

## 2024-01-22 DIAGNOSIS — E11311 Type 2 diabetes mellitus with unspecified diabetic retinopathy with macular edema: Secondary | ICD-10-CM

## 2024-01-22 MED ORDER — TIRZEPATIDE 10 MG/0.5ML ~~LOC~~ SOAJ: 10.0000 mg | SUBCUTANEOUS | 1 refills | Status: DC

## 2024-02-12 ENCOUNTER — Other Ambulatory Visit: Payer: Self-pay | Admitting: Family Medicine

## 2024-02-12 DIAGNOSIS — E1159 Type 2 diabetes mellitus with other circulatory complications: Secondary | ICD-10-CM

## 2024-02-12 DIAGNOSIS — E781 Pure hyperglyceridemia: Secondary | ICD-10-CM

## 2024-02-12 DIAGNOSIS — E1169 Type 2 diabetes mellitus with other specified complication: Secondary | ICD-10-CM

## 2024-04-10 ENCOUNTER — Ambulatory Visit (INDEPENDENT_AMBULATORY_CARE_PROVIDER_SITE_OTHER): Admitting: Family Medicine

## 2024-04-10 ENCOUNTER — Encounter: Payer: Self-pay | Admitting: Family Medicine

## 2024-04-10 VITALS — BP 126/80 | HR 67 | Ht 71.0 in | Wt 236.0 lb

## 2024-04-10 DIAGNOSIS — E1169 Type 2 diabetes mellitus with other specified complication: Secondary | ICD-10-CM

## 2024-04-10 DIAGNOSIS — Z860101 Personal history of adenomatous and serrated colon polyps: Secondary | ICD-10-CM

## 2024-04-10 DIAGNOSIS — I152 Hypertension secondary to endocrine disorders: Secondary | ICD-10-CM

## 2024-04-10 DIAGNOSIS — K7460 Unspecified cirrhosis of liver: Secondary | ICD-10-CM

## 2024-04-10 DIAGNOSIS — Z0000 Encounter for general adult medical examination without abnormal findings: Secondary | ICD-10-CM | POA: Diagnosis not present

## 2024-04-10 DIAGNOSIS — E781 Pure hyperglyceridemia: Secondary | ICD-10-CM

## 2024-04-10 DIAGNOSIS — E11311 Type 2 diabetes mellitus with unspecified diabetic retinopathy with macular edema: Secondary | ICD-10-CM | POA: Diagnosis not present

## 2024-04-10 DIAGNOSIS — E785 Hyperlipidemia, unspecified: Secondary | ICD-10-CM

## 2024-04-10 DIAGNOSIS — N5201 Erectile dysfunction due to arterial insufficiency: Secondary | ICD-10-CM

## 2024-04-10 DIAGNOSIS — T383X5D Adverse effect of insulin and oral hypoglycemic [antidiabetic] drugs, subsequent encounter: Secondary | ICD-10-CM

## 2024-04-10 DIAGNOSIS — T466X5A Adverse effect of antihyperlipidemic and antiarteriosclerotic drugs, initial encounter: Secondary | ICD-10-CM

## 2024-04-10 DIAGNOSIS — C189 Malignant neoplasm of colon, unspecified: Secondary | ICD-10-CM

## 2024-04-10 DIAGNOSIS — E1159 Type 2 diabetes mellitus with other circulatory complications: Secondary | ICD-10-CM | POA: Diagnosis not present

## 2024-04-10 DIAGNOSIS — K219 Gastro-esophageal reflux disease without esophagitis: Secondary | ICD-10-CM

## 2024-04-10 DIAGNOSIS — T383X5A Adverse effect of insulin and oral hypoglycemic [antidiabetic] drugs, initial encounter: Secondary | ICD-10-CM

## 2024-04-10 DIAGNOSIS — E669 Obesity, unspecified: Secondary | ICD-10-CM

## 2024-04-10 LAB — POCT GLYCOSYLATED HEMOGLOBIN (HGB A1C): Hemoglobin A1C: 5.3 % (ref 4.0–5.6)

## 2024-04-10 MED ORDER — FENOFIBRATE 145 MG PO TABS
145.0000 mg | ORAL_TABLET | Freq: Every day | ORAL | 3 refills | Status: AC
Start: 2024-04-10 — End: ?

## 2024-04-10 MED ORDER — LISINOPRIL-HYDROCHLOROTHIAZIDE 10-12.5 MG PO TABS: 1.0000 | ORAL_TABLET | Freq: Every day | ORAL | 3 refills | Status: AC

## 2024-04-10 MED ORDER — DAPAGLIFLOZIN PROPANEDIOL 5 MG PO TABS: 5.0000 mg | ORAL_TABLET | Freq: Every day | ORAL | 1 refills | Status: DC

## 2024-04-10 MED ORDER — ICOSAPENT ETHYL 1 G PO CAPS: 2.0000 g | ORAL_CAPSULE | Freq: Two times a day (BID) | ORAL | 3 refills | Status: AC

## 2024-04-10 MED ORDER — TIRZEPATIDE 10 MG/0.5ML ~~LOC~~ SOAJ
10.0000 mg | SUBCUTANEOUS | 1 refills | Status: DC
Start: 2024-04-10 — End: 2024-07-07

## 2024-04-10 MED ORDER — OMEPRAZOLE 40 MG PO CPDR: 40.0000 mg | DELAYED_RELEASE_CAPSULE | Freq: Two times a day (BID) | ORAL | 1 refills | Status: AC

## 2024-04-10 MED ORDER — EZETIMIBE 10 MG PO TABS: 10.0000 mg | ORAL_TABLET | Freq: Every day | ORAL | 3 refills | Status: DC

## 2024-04-10 NOTE — Progress Notes (Signed)
 Complete physical exam  Patient: Trevor Johnson   DOB: 16-May-1961   63 y.o. Male  MRN: [id number]  Subjective:    Chief Complaint  Patient presents with   Annual Exam    Cpe. Nonfasting. Did not know it was a physical.     Trevor Johnson is a 63 y.o. male who presents today for a complete physical exam.  He reports consuming a general diet. The patient has a physically strenuous job, but has no regular exercise apart from work.  He generally feels well. He reports sleeping well.  He is now taking Mounjaro  and is very happy with the results of this.  He has lost about 28 pounds and states that he has a lot more energy.  He is also taking Farxiga  and having no difficulty with that.  He has had difficulty with antilipid Evac medications and seems to be fairly stable on Zetia  as well as Vascepa .  Continues on lisinopril .  He is taking Prilosec daily for chest related symptoms and doing that twice per day.  Uses sildenafil  on an as-needed basis.  He has seen GI for his adenomatous colonic polyps.  No evidence of colon cancer.  Does have a history of cirrhosis and does follow-up regularly with GI.  Most recent fall risk assessment:    04/10/2024    2:28 PM  Fall Risk   Falls in the past year? 0  Number falls in past yr: 0  Injury with Fall? 0  Risk for fall due to : No Fall Risks  Follow up Falls evaluation completed     Most recent depression screenings:    04/10/2024    2:29 PM 12/05/2023    3:33 PM  PHQ 2/9 Scores  PHQ - 2 Score 0 0    Vision:Within last year and Dental: No current dental problems and Receives regular dental care    Immunization History  Administered Date(s) Administered   Hep A / Hep B 03/08/2021, 04/07/2021   Hepatitis A, Adult 09/08/2021   Hepatitis B, PED/ADOLESCENT 09/08/2021, 09/08/2021   Influenza Split 06/14/2012   Influenza Whole 06/02/2011   Influenza, Seasonal, Injecte, Preservative Fre 07/12/2023   Influenza,inj,Quad PF,6+ Mos 07/07/2013,  07/21/2015, 05/23/2016, 05/22/2017, 08/06/2019, 11/14/2021, 05/16/2022   Moderna Sars-Covid-2 Vaccination 11/30/2019, 12/28/2019, 09/06/2020   Pneumococcal Conjugate-13 08/06/2019, 11/17/2020   Tdap 09/11/2011, 11/17/2020   Zoster Recombinant(Shingrix ) 07/12/2023, 12/05/2023    Health Maintenance  Topic Date Due   HIV Screening  Never done   Pneumococcal Vaccine 38-74 Years old (2 of 2 - PPSV23, PCV20, or PCV21) 01/12/2021   OPHTHALMOLOGY EXAM  09/30/2021   COVID-19 Vaccine (4 - 2024-25 season) 05/27/2023   Diabetic kidney evaluation - eGFR measurement  01/11/2024   Diabetic kidney evaluation - Urine ACR  01/11/2024   FOOT EXAM  01/11/2024   INFLUENZA VACCINE  04/25/2024   HEMOGLOBIN A1C  06/06/2024   Colonoscopy  09/06/2026   DTaP/Tdap/Td (3 - Td or Tdap) 11/17/2030   Hepatitis B Vaccines  Completed   Hepatitis C Screening  Completed   Zoster Vaccines- Shingrix   Completed   HPV VACCINES  Aged Out   Meningococcal B Vaccine  Aged Out    Patient Care Team: Trevor Norleen BROCKS, MD as PCP - General (Family Medicine) Trevor Oneil BROCKS, MD as PCP - Cardiology (Cardiology)   Outpatient Medications Prior to Visit  Medication Sig   sildenafil  (REVATIO ) 20 MG tablet TAKE UP TO 5 TABLETS BY MOUTH ONCE DAILY AS NEEDED   [  DISCONTINUED] dapagliflozin  propanediol (FARXIGA ) 5 MG TABS tablet Take 1 tablet (5 mg total) by mouth daily before breakfast.   [DISCONTINUED] ezetimibe  (ZETIA ) 10 MG tablet TAKE 1 TABLET BY MOUTH EVERY DAY   [DISCONTINUED] fenofibrate  (TRICOR ) 145 MG tablet Take 1 tablet (145 mg total) by mouth daily.   [DISCONTINUED] icosapent  Ethyl (VASCEPA ) 1 g capsule TAKE 2 CAPSULES BY MOUTH 2 TIMES DAILY.   [DISCONTINUED] lisinopril -hydrochlorothiazide  (ZESTORETIC ) 10-12.5 MG tablet TAKE 1 TABLET BY MOUTH EVERY DAY   [DISCONTINUED] omeprazole  (PRILOSEC) 40 MG capsule TAKE 1 CAPSULE (40 MG TOTAL) BY MOUTH IN THE MORNING AND AT BEDTIME.   [DISCONTINUED] tirzepatide  (MOUNJARO ) 10 MG/0.5ML  Pen Inject 10 mg into the skin once a week.   Facility-Administered Medications Prior to Visit  Medication Dose Route Frequency Provider   0.9 %  sodium chloride  infusion  500 mL Intravenous Once Armbruster, Elspeth SQUIBB, MD    Review of Systems  All other systems reviewed and are negative.   Family and social history as well as health maintenance and immunizations was reviewed.     Objective:      Physical Exam  Alert and in no distress. Tympanic membranes and canals are normal. Pharyngeal area is normal. Neck is supple without adenopathy or thyromegaly. Cardiac exam shows a regular sinus rhythm without murmurs or gallops. Lungs are clear to auscultation. Hemoglobin A1c is 5.3     Assessment & Plan:    Routine general medical examination at a health care facility  Adverse reaction to antihyperlipidemic drug, initial encounter  Adverse effect of sodium-glucose cotransporter 2 (SGLT2) inhibitor  Cirrhosis of liver without ascites, unspecified hepatic cirrhosis type (HCC)  Hyperlipidemia associated with type 2 diabetes mellitus (HCC) - Plan: Lipid panel, icosapent  Ethyl (VASCEPA ) 1 g capsule, ezetimibe  (ZETIA ) 10 MG tablet, fenofibrate  (TRICOR ) 145 MG tablet  Hypertension associated with diabetes (HCC) - Plan: CBC with Differential/Platelet, Comprehensive metabolic panel with GFR, lisinopril -hydrochlorothiazide  (ZESTORETIC ) 10-12.5 MG tablet  Metformin  adverse reaction, subsequent encounter  Obesity (BMI 30-39.9)  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, without long-term current use of insulin , unspecified laterality, unspecified retinopathy severity (HCC) - Plan: CBC with Differential/Platelet, Comprehensive metabolic panel with GFR, Lipid panel, POCT glycosylated hemoglobin (Hb A1C), POCT UA - Microalbumin, dapagliflozin  propanediol (FARXIGA ) 5 MG TABS tablet, tirzepatide  (MOUNJARO ) 10 MG/0.5ML Pen  Erectile dysfunction due to arterial insufficiency  Hx of  adenomatous colonic polyps  Hypertriglyceridemia - Plan: icosapent  Ethyl (VASCEPA ) 1 g capsule  Gastroesophageal reflux disease, unspecified whether esophagitis present - Plan: omeprazole  (PRILOSEC) 40 MG capsule  I congratulated him on the good work that he is doing and we will continue on his present medication regimen except for the Prilosec.  Try to have him back off to just once per day and then potentially even every other day.  Discussed continuing with weight loss and see how far he can get with his present medication regimen.  Recommend that he set a particular waist size but the idea is to have a flat belly. Return in about 6 months (around 10/11/2024).      Norleen Jobs, MD

## 2024-04-11 ENCOUNTER — Ambulatory Visit: Payer: Self-pay | Admitting: Family Medicine

## 2024-04-11 LAB — COMPREHENSIVE METABOLIC PANEL WITH GFR
ALT: 35 IU/L (ref 0–44)
AST: 34 IU/L (ref 0–40)
Albumin: 4.4 g/dL (ref 3.9–4.9)
Alkaline Phosphatase: 66 IU/L (ref 44–121)
BUN/Creatinine Ratio: 12 (ref 10–24)
BUN: 16 mg/dL (ref 8–27)
Bilirubin Total: 0.7 mg/dL (ref 0.0–1.2)
CO2: 22 mmol/L (ref 20–29)
Calcium: 9.4 mg/dL (ref 8.6–10.2)
Chloride: 100 mmol/L (ref 96–106)
Creatinine, Ser: 1.33 mg/dL — ABNORMAL HIGH (ref 0.76–1.27)
Globulin, Total: 2.6 g/dL (ref 1.5–4.5)
Glucose: 91 mg/dL (ref 70–99)
Potassium: 4 mmol/L (ref 3.5–5.2)
Sodium: 137 mmol/L (ref 134–144)
Total Protein: 7 g/dL (ref 6.0–8.5)
eGFR: 61 mL/min/1.73 (ref >59)

## 2024-04-11 LAB — CBC WITH DIFFERENTIAL/PLATELET
Basophils Absolute: 0 x10E3/uL (ref 0.0–0.2)
Basos: 0 %
EOS (ABSOLUTE): 0.1 x10E3/uL (ref 0.0–0.4)
Eos: 2 %
Hematocrit: 47 % (ref 37.5–51.0)
Hemoglobin: 16.1 g/dL (ref 13.0–17.7)
Immature Grans (Abs): 0 x10E3/uL (ref 0.0–0.1)
Immature Granulocytes: 0 %
Lymphocytes Absolute: 1.6 x10E3/uL (ref 0.7–3.1)
Lymphs: 29 %
MCH: 33.5 pg — ABNORMAL HIGH (ref 26.6–33.0)
MCHC: 34.3 g/dL (ref 31.5–35.7)
MCV: 98 fL — ABNORMAL HIGH (ref 79–97)
Monocytes Absolute: 0.5 x10E3/uL (ref 0.1–0.9)
Monocytes: 9 %
Neutrophils Absolute: 3.2 x10E3/uL (ref 1.4–7.0)
Neutrophils: 60 %
Platelets: 174 x10E3/uL (ref 150–450)
RBC: 4.81 x10E6/uL (ref 4.14–5.80)
RDW: 12.5 % (ref 11.6–15.4)
WBC: 5.4 x10E3/uL (ref 3.4–10.8)

## 2024-04-11 LAB — LIPID PANEL
Chol/HDL Ratio: 5 ratio (ref 0.0–5.0)
Cholesterol, Total: 141 mg/dL (ref 100–199)
HDL: 28 mg/dL — ABNORMAL LOW (ref >39)
LDL Chol Calc (NIH): 77 mg/dL (ref 0–99)
Triglycerides: 215 mg/dL — ABNORMAL HIGH (ref 0–149)
VLDL Cholesterol Cal: 36 mg/dL (ref 5–40)

## 2024-07-04 ENCOUNTER — Encounter: Payer: Self-pay | Admitting: Family Medicine

## 2024-07-07 MED ORDER — TIRZEPATIDE 12.5 MG/0.5ML ~~LOC~~ SOAJ: 12.5000 mg | SUBCUTANEOUS | 0 refills | Status: DC

## 2024-09-27 ENCOUNTER — Other Ambulatory Visit: Payer: Self-pay | Admitting: Family Medicine

## 2024-10-14 ENCOUNTER — Ambulatory Visit (INDEPENDENT_AMBULATORY_CARE_PROVIDER_SITE_OTHER): Payer: Self-pay | Admitting: Family Medicine

## 2024-10-14 VITALS — BP 136/78 | HR 88 | Ht 71.0 in | Wt 225.6 lb

## 2024-10-14 DIAGNOSIS — E11311 Type 2 diabetes mellitus with unspecified diabetic retinopathy with macular edema: Secondary | ICD-10-CM | POA: Diagnosis not present

## 2024-10-14 DIAGNOSIS — E669 Obesity, unspecified: Secondary | ICD-10-CM

## 2024-10-14 DIAGNOSIS — E785 Hyperlipidemia, unspecified: Secondary | ICD-10-CM | POA: Diagnosis not present

## 2024-10-14 DIAGNOSIS — E1159 Type 2 diabetes mellitus with other circulatory complications: Secondary | ICD-10-CM

## 2024-10-14 DIAGNOSIS — I152 Hypertension secondary to endocrine disorders: Secondary | ICD-10-CM

## 2024-10-14 DIAGNOSIS — M9262 Juvenile osteochondrosis of tarsus, left ankle: Secondary | ICD-10-CM | POA: Diagnosis not present

## 2024-10-14 DIAGNOSIS — E1169 Type 2 diabetes mellitus with other specified complication: Secondary | ICD-10-CM

## 2024-10-14 MED ORDER — TIRZEPATIDE 15 MG/0.5ML ~~LOC~~ SOAJ: 15.0000 mg | SUBCUTANEOUS | 1 refills | Status: AC

## 2024-10-14 NOTE — Progress Notes (Unsigned)
 " Subjective:    Patient ID: Trevor Johnson, male    DOB: [birth year], 62 y.o.   MRN: [id number]  Trevor Johnson is a 64 y.o. male who presents for follow-up of Type 2 diabetes mellitus.  Home blood sugar records: 118 Current symptoms/problems include none and have been improving. Daily foot checks: yes   Any foot concerns: no How often blood sugars checked:every other day Exercise: The patient has a physically strenuous job, but has no regular exercise apart from work.  Diet: general diet Discussed the use of AI scribe software for clinical note transcription with the patient, who gave verbal consent to proceed.   He has been managing his diabetes with Mounjaro  since March of the previous year, starting at a dose of 5 mg. His A1c was initially 9.9% in March, which improved to 5.3 by July after starting Mounjaro , increasing physical activity, and losing weight. He is currently on a 12.5 mg dose and has lost a total of 50 pounds, with his weight decreasing from 274 pounds to 225 pounds. His A1c remains stable at 5.2%.  He wants to further reduce his weight to achieve a waist size of 36 inches, as he is currently at 38 inches. He experiences some anxiety related to the availability of his medication, which has been alleviated by switching to a three-month supply.  He has a lump on his left foot, which has been present for years and is now causing discomfort when wearing certain types of shoes. The lump is not painful unless pressure is applied by wearing specific footwear. He attempted to alleviate the issue by hitting it with a hammer, resulting in soreness. He apparently is taking Vascepa  and he continues on his lisinopril . He is planning a trip to [place] and is concerned about receiving vaccinations before his travel, preferring to wait until after his return. He and his wife plan to visit [place] and [place].      The following portions of the patient's history were reviewed and updated as  appropriate: allergies, current medications, past medical history, past social history and problem list.  ROS as in subjective above.     Objective:    Physical Exam Alert and in no distress exam the left foot does show evidence of a Haglund's deformity.  Hemoglobin A1c is 5.2  Lab Review    Latest Ref Rng & Units 04/10/2024    3:43 PM 04/10/2024    3:34 PM 12/05/2023    3:58 PM 07/12/2023   12:12 PM 01/11/2023    9:25 AM  Diabetic Labs  HbA1c 4.0 - 5.6 % 5.3   9.9  7.7    Chol 100 - 199 mg/dL  858    814   HDL >60 mg/dL  28    26   Calc LDL 0 - 99 mg/dL  77    897   Triglycerides 0 - 149 mg/dL  784    663   Creatinine 0.76 - 1.27 mg/dL  8.66    9.01       8/79/7973    3:44 PM 04/10/2024    2:29 PM 12/05/2023    3:33 PM 09/07/2023    9:34 AM 09/07/2023    9:24 AM  BP/Weight  Systolic BP 136 126 120 119 122  Diastolic BP 78 80 72 74 71  Wt. (Lbs) 225.6 236 242.8    BMI 31.46 kg/m2 32.92 kg/m2 33.86 kg/m2        01/11/2023    8:30  AM 11/17/2020    8:15 AM  Foot/eye exam completion dates  Foot Form Completion Done Done    Trevor Johnson  reports that he has never smoked. He has never used smokeless tobacco. He reports that he does not drink alcohol and does not use drugs.     Assessment & Plan:       Haglund's deformity of left heel Chronic deformity limiting shoe options, more lateral than typical, complicating potential surgery. - Referred to sports surgeon at [facility] for evaluation and management. - Consider heel cup for padding and shoe accommodation.  Obesity Significant weight loss achieved with Mounjaro  and lifestyle changes. A1c improved. Further weight loss desired. - Increased Mounjaro  to 15 mg for additional weight loss.      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, without long-term current use of insulin , unspecified laterality, unspecified retinopathy severity (HCC) - Plan: HgB A1c, tirzepatide  (MOUNJARO ) 15 MG/0.5ML  Pen   Hyperlipidemia associated with type 2 diabetes mellitus (HCC)  Hypertension associated with diabetes (HCC)  Recheck 6 months   "

## 2024-10-15 LAB — POCT GLYCOSYLATED HEMOGLOBIN (HGB A1C): Hemoglobin A1C: 5.2 % (ref 4.0–5.6)

## 2024-10-21 ENCOUNTER — Ambulatory Visit: Admitting: Physician Assistant

## 2024-10-21 ENCOUNTER — Other Ambulatory Visit: Payer: Self-pay

## 2024-10-21 DIAGNOSIS — G8929 Other chronic pain: Secondary | ICD-10-CM

## 2024-10-21 DIAGNOSIS — M79672 Pain in left foot: Secondary | ICD-10-CM

## 2024-10-21 NOTE — Progress Notes (Unsigned)
 "  Office Visit Note   Patient: Trevor Johnson           Date of Birth: 1960-12-29           MRN: [id number] Visit Date: 10/21/2024              Requested by: Joyce Norleen BROCKS, MD [address] [place],  [place] [address] PCP: Joyce Norleen BROCKS, MD  Chief Complaint  Patient presents with   Left Foot - Pain      HPI:63 y/o male with a chief complaint of left foot nodule on the posterior heel/achilles tendon area.  He states it causes him pain at times and it causes his shoes to wear out quickly.  About a month ago he attempted to hit with a hammer and opened it up removing bloody white substance.  It became sore for a while and then closed up.   He does have a history of gout with his last acute flare up 2-3 years ago.    Assessment & Plan: Visit Diagnoses:  1. Chronic heel pain, left     Plan:  Hagland deformity verse gout with tophus  Uric acid drawn shows 4.2.  A blood uric acid level of 4.2 mg/dL is considered normal for both men and women, as it falls well within the typical reference range of 3.0 to 7.0-7.2 mg/dL. This result indicates a healthy level of uric acid in the blood, suggests no immediate risk of gout Uric acid blood work was initiated today He has calcified vessels on x ray, DM and hypertension.  This will make surgical intervention a higher risk and after discussing it with Dr. Harden we do not recommend n elective surgical intervention.    He will need to wear shoes that are comfortable.  He may benefit from a heel lift or a backless shoe may also be an option.    Follow-Up Instructions: Return if symptoms worsen or fail to improve.   Ortho Exam  Patient is alert, oriented, no adenopathy, well-dressed, normal affect, normal respiratory effort. Bony feeling prominence lateral to the achilles posterior left foot.  No edema or cellulitis is noted.  He is non tender to palpation.  Dorsiflexion 3 degrees past neutral.         Imaging: Posterior and plantar  heel spurs.  Of note he has both PT and AT calcified vessels.    Labs: Lab Results  Component Value Date   HGBA1C 5.2 10/15/2024   HGBA1C 5.3 04/10/2024   HGBA1C 9.9 (A) 12/05/2023   LABURIC 4.2 10/21/2024     Lab Results  Component Value Date   ALBUMIN 4.4 04/10/2024   ALBUMIN 4.3 01/11/2023   ALBUMIN 4.4 02/22/2022    No results found for: MG No results found for: VD25OH  No results found for: PREALBUMIN    Latest Ref Rng & Units 04/10/2024    3:34 PM 01/11/2023    9:25 AM 02/22/2022    4:47 PM  CBC EXTENDED  WBC 3.4 - 10.8 x10E3/uL 5.4  5.3  5.9   RBC 4.14 - 5.80 x10E6/uL 4.81  4.88  4.80   Hemoglobin 13.0 - 17.7 g/dL 83.8  83.4  83.6   HCT 37.5 - 51.0 % 47.0  46.2  45.7   Platelets 150 - 450 x10E3/uL 174  175  240   NEUT# 1.4 - 7.0 x10E3/uL 3.2  3.2  3.2   Lymph# 0.7 - 3.1 x10E3/uL 1.6  1.4  2.0  There is no height or weight on file to calculate BMI.  Orders:  Orders Placed This Encounter  Procedures   XR Os Calcis Left   Uric acid   No orders of the defined types were placed in this encounter.    Procedures: No procedures performed  Clinical Data: No additional findings.  ROS:  All other systems negative, except as noted in the HPI. Review of Systems  Objective: Vital Signs: There were no vitals taken for this visit.  Specialty Comments:  No specialty comments available.  PMFS History: Patient Active Problem List   Diagnosis Date Noted   Metformin  adverse reaction, subsequent encounter 09/27/2022   Cirrhosis of liver without ascites (HCC) 02/22/2022   Fatty liver 03/08/2021   Adverse effect of sodium-glucose cotransporter 2 (SGLT2) inhibitor 02/03/2020   Vasculogenic erectile dysfunction 02/03/2020   Adverse reaction to antihyperlipidemic drug, initial encounter 05/22/2017   Hypertension associated with diabetes (HCC) 05/20/2012   History of renal stone 05/20/2012   Type 2 diabetes mellitus with ophthalmic complication (HCC)  06/02/2011   Obesity (BMI 30-39.9) 06/02/2011   Hyperlipidemia associated with type 2 diabetes mellitus (HCC) 06/02/2011   Past Medical History:  Diagnosis Date   Allergy    RHINITIS   Cirrhosis of liver (HCC) 02/2021   Colon cancer (HCC) 01/2021   malignant colon polyp (sigmoid colon)   Colon cancer (HCC) 02/22/2022   Diabetes mellitus    Fatty liver    Hypertension    Hypertriglyceridemia    Obesity    Renal stone     Family History  Problem Relation Age of Onset   Colon cancer Maternal Grandfather    Colon polyps Neg Hx    Esophageal cancer Neg Hx    Rectal cancer Neg Hx    Stomach cancer Neg Hx     Past Surgical History:  Procedure Laterality Date   COLONOSCOPY     URETHRAL STRICTURE DILATATION     Social History   Occupational History   Not on file  Tobacco Use   Smoking status: Never   Smokeless tobacco: Never  Vaping Use   Vaping status: Never Used  Substance and Sexual Activity   Alcohol use: No   Drug use: No   Sexual activity: Yes    Birth control/protection: Surgical       "

## 2024-10-22 ENCOUNTER — Encounter: Payer: Self-pay | Admitting: Physician Assistant

## 2024-10-22 LAB — URIC ACID: Uric Acid, Serum: 4.2 mg/dL (ref 4.0–8.0)
